# Patient Record
Sex: Male | Born: 1995 | Race: White | Hispanic: No | Marital: Single | Smoking: Current some day smoker
Health system: Southern US, Community
[De-identification: ages and names within clinical notes are randomized; demographics above are authoritative.]

## PROBLEM LIST (undated history)

## (undated) DIAGNOSIS — A6000 Herpesviral infection of urogenital system, unspecified: Secondary | ICD-10-CM

## (undated) DIAGNOSIS — F319 Bipolar disorder, unspecified: Secondary | ICD-10-CM

## (undated) DIAGNOSIS — F209 Schizophrenia, unspecified: Secondary | ICD-10-CM

## (undated) DIAGNOSIS — D649 Anemia, unspecified: Secondary | ICD-10-CM

## (undated) DIAGNOSIS — F32A Depression, unspecified: Secondary | ICD-10-CM

## (undated) DIAGNOSIS — F419 Anxiety disorder, unspecified: Secondary | ICD-10-CM

## (undated) DIAGNOSIS — F329 Major depressive disorder, single episode, unspecified: Secondary | ICD-10-CM

## (undated) HISTORY — DX: Anemia, unspecified: D64.9

## (undated) HISTORY — PX: NO PAST SURGERIES: SHX2092

## (undated) HISTORY — DX: Major depressive disorder, single episode, unspecified: F32.9

## (undated) HISTORY — DX: Depression, unspecified: F32.A

---

## 2014-12-23 ENCOUNTER — Encounter (HOSPITAL_COMMUNITY): Payer: Self-pay | Admitting: Emergency Medicine

## 2014-12-23 ENCOUNTER — Emergency Department (HOSPITAL_COMMUNITY)
Admission: EM | Admit: 2014-12-23 | Discharge: 2014-12-23 | Disposition: A | Discharge status: Home / Self Care | Payer: Medicaid Other | Attending: Emergency Medicine | Admitting: Emergency Medicine

## 2014-12-23 DIAGNOSIS — L739 Follicular disorder, unspecified: Secondary | ICD-10-CM | POA: Diagnosis not present

## 2014-12-23 DIAGNOSIS — R590 Localized enlarged lymph nodes: Secondary | ICD-10-CM | POA: Diagnosis not present

## 2014-12-23 DIAGNOSIS — Z720 Tobacco use: Secondary | ICD-10-CM | POA: Insufficient documentation

## 2014-12-23 DIAGNOSIS — R2240 Localized swelling, mass and lump, unspecified lower limb: Secondary | ICD-10-CM | POA: Diagnosis present

## 2014-12-23 MED ORDER — CEPHALEXIN 500 MG PO CAPS: 500.0000 mg | ORAL_CAPSULE | Freq: Four times a day (QID) | ORAL | Status: DC

## 2014-12-23 NOTE — ED Notes (Signed)
Pt reports "bulge" in left inguinal area; pt suspects hernia; pt also states "there are some lumps in the same area"; pt reports lightheadedness but n/v/d; pt reports regular BM;

## 2014-12-23 NOTE — ED Provider Notes (Signed)
CSN: [id number]     Arrival date & time 12/23/14  1829 History   First MD Initiated Contact with Patient 12/23/14 1953     Chief Complaint  Patient presents with  . Inguinal Hernia     (Consider location/radiation/quality/duration/timing/severity/associated sxs/prior Treatment) HPI Comments: Patient presents to the emergency department with chief complaints of lumps in groin, as well as some redness of his pubic area.  He denies any fevers chills. States that he does shave. States that he is in a monogamous relationship. Denies any penile discharge or dysuria. Denies any pain in his testicles. There are no aggravating or relieving factors. He has not tried anything to alleviate the symptoms.  The history is provided by the patient. No language interpreter was used.    History reviewed. No pertinent past medical history. History reviewed. No pertinent past surgical history. History reviewed. No pertinent family history. History  Substance Use Topics  . Smoking status: Current Every Day Smoker -- 0.50 packs/day    Types: Cigarettes  . Smokeless tobacco: Not on file  . Alcohol Use: Yes     Comment: occasional    Review of Systems  Constitutional: Negative for fever and chills.  Respiratory: Negative for shortness of breath.   Cardiovascular: Negative for chest pain.  Gastrointestinal: Negative for nausea, vomiting, diarrhea and constipation.  Genitourinary: Negative for dysuria.  Skin: Positive for color change.       Folliculitis pubic area      Allergies  Sulfa antibiotics  Home Medications   Prior to Admission medications   Not on File   BP 136/85 mmHg  Pulse 98  Temp(Src) 98.9 F (37.2 C) (Oral)  Resp 18  Ht 5\' 6"  (1.676 m)  Wt 140 lb (63.504 kg)  BMI 22.61 kg/m2  SpO2 100% Physical Exam  Constitutional: He is oriented to person, place, and time. He appears well-developed and well-nourished.  HENT:  Head: Normocephalic and atraumatic.  Eyes: Conjunctivae  and EOM are normal. Pupils are equal, round, and reactive to light. Right eye exhibits no discharge. Left eye exhibits no discharge. No scleral icterus.  Neck: Normal range of motion. Neck supple. No JVD present.  Cardiovascular: Normal rate, regular rhythm and normal heart sounds.  Exam reveals no gallop and no friction rub.   No murmur heard. Pulmonary/Chest: Effort normal and breath sounds normal. No respiratory distress. He has no wheezes. He has no rales. He exhibits no tenderness.  Abdominal: Soft. He exhibits no distension and no mass. There is no tenderness. There is no rebound and no guarding.  Genitourinary:  Normal circumcised male, left inguinal lymphadenopathy, no hernia, no other mass, abnormality, or do for about the penis, scrotum, testes  Musculoskeletal: Normal range of motion. He exhibits no edema or tenderness.  Neurological: He is alert and oriented to person, place, and time.  Skin: Skin is warm and dry.  Folliculitis of pubic region  Psychiatric: He has a normal mood and affect. His behavior is normal. Judgment and thought content normal.  Nursing note and vitals reviewed.   ED Course  Procedures (including critical care time) Labs Review Labs Reviewed - No data to display  Imaging Review No results found.   EKG Interpretation None      MDM   Final diagnoses:  Folliculitis  Lymphadenopathy, inguinal    Patient with folliculitis and inguinal lymphadenopathy. No hernia. Discharged home with Keflex. Return precautions given.    Roxy Horsemanobert Damara Klunder, PA-C 12/23/14 2013  Roxy Horsemanobert Angelis Gates, PA-C 12/23/14 2014  Margarita Grizzle, MD 12/25/14 1500

## 2014-12-23 NOTE — Discharge Instructions (Signed)
Lymphadenopathy °Lymphadenopathy means "disease of the lymph glands." But the term is usually used to describe swollen or enlarged lymph glands, also called lymph nodes. These are the bean-shaped organs found in many locations including the neck, underarm, and groin. Lymph glands are part of the immune system, which fights infections in your body. Lymphadenopathy can occur in just one area of the body, such as the neck, or it can be generalized, with lymph node enlargement in several areas. The nodes found in the neck are the most common sites of lymphadenopathy. °CAUSES °When your immune system responds to germs (such as viruses or bacteria ), infection-fighting cells and fluid build up. This causes the glands to grow in size. Usually, this is not something to worry about. Sometimes, the glands themselves can become infected and inflamed. This is called lymphadenitis. °Enlarged lymph nodes can be caused by many diseases: °· Bacterial disease, such as strep throat or a skin infection. °· Viral disease, such as a common cold. °· Other germs, such as Lyme disease, tuberculosis, or sexually transmitted diseases. °· Cancers, such as lymphoma (cancer of the lymphatic system) or leukemia (cancer of the white blood cells). °· Inflammatory diseases such as lupus or rheumatoid arthritis. °· Reactions to medications. °Many of the diseases above are rare, but important. This is why you should see your caregiver if you have lymphadenopathy. °SYMPTOMS °· Swollen, enlarged lumps in the neck, back of the head, or other locations. °· Tenderness. °· Warmth or redness of the skin over the lymph nodes. °· Fever. °DIAGNOSIS °Enlarged lymph nodes are often near the source of infection. They can help health care providers diagnose your illness. For instance: °· Swollen lymph nodes around the jaw might be caused by an infection in the mouth. °· Enlarged glands in the neck often signal a throat infection. °· Lymph nodes that are swollen in  more than one area often indicate an illness caused by a virus. °Your caregiver will likely know what is causing your lymphadenopathy after listening to your history and examining you. Blood tests, x-rays, or other tests may be needed. If the cause of the enlarged lymph node cannot be found, and it does not go away by itself, then a biopsy may be needed. Your caregiver will discuss this with you. °TREATMENT °Treatment for your enlarged lymph nodes will depend on the cause. Many times the nodes will shrink to normal size by themselves, with no treatment. Antibiotics or other medicines may be needed for infection. Only take over-the-counter or prescription medicines for pain, discomfort, or fever as directed by your caregiver. °HOME CARE INSTRUCTIONS °Swollen lymph glands usually return to normal when the underlying medical condition goes away. If they persist, contact your health-care provider. He/she might prescribe antibiotics or other treatments, depending on the diagnosis. Take any medications exactly as prescribed. Keep any follow-up appointments made to check on the condition of your enlarged nodes. °SEEK MEDICAL CARE IF: °· Swelling lasts for more than two weeks. °· You have symptoms such as weight loss, night sweats, fatigue, or fever that does not go away. °· The lymph nodes are hard, seem fixed to the skin, or are growing rapidly. °· Skin over the lymph nodes is red and inflamed. This could mean there is an infection. °SEEK IMMEDIATE MEDICAL CARE IF: °· Fluid starts leaking from the area of the enlarged lymph node. °· You develop a fever of 102° F (38.9° C) or greater. °· Severe pain develops (not necessarily at the site of a   large lymph node).  You develop chest pain or shortness of breath.  You develop worsening abdominal pain. MAKE SURE YOU:  Understand these instructions.  Will watch your condition.  Will get help right away if you are not doing well or get worse. Document Released:  05/18/2008 Document Revised: 12/24/2013 Document Reviewed: 05/18/2008 [facility] Patient Information 2015 [place], [place]. This information is not intended to replace advice given to you by your health care provider. Make sure you discuss any questions you have with your health care provider. Folliculitis  Folliculitis is redness, soreness, and swelling (inflammation) of the hair follicles. This condition can occur anywhere on the body. People with weakened immune systems, diabetes, or obesity have a greater risk of getting folliculitis. CAUSES  Bacterial infection. This is the most common cause.  Fungal infection.  Viral infection.  Contact with certain chemicals, especially oils and tars. Long-term folliculitis can result from bacteria that live in the nostrils. The bacteria may trigger multiple outbreaks of folliculitis over time. SYMPTOMS Folliculitis most commonly occurs on the scalp, thighs, legs, back, buttocks, and areas where hair is shaved frequently. An early sign of folliculitis is a small, white or yellow, pus-filled, itchy lesion (pustule). These lesions appear on a red, inflamed follicle. They are usually less than 0.2 inches (5 mm) wide. When there is an infection of the follicle that goes deeper, it becomes a boil or furuncle. A group of closely packed boils creates a larger lesion (carbuncle). Carbuncles tend to occur in hairy, sweaty areas of the body. DIAGNOSIS  Your caregiver can usually tell what is wrong by doing a physical exam. A sample may be taken from one of the lesions and tested in a lab. This can help determine what is causing your folliculitis. TREATMENT  Treatment may include:  Applying warm compresses to the affected areas.  Taking antibiotic medicines orally or applying them to the skin.  Draining the lesions if they contain a large amount of pus or fluid.  Laser hair removal for cases of long-lasting folliculitis. This helps to prevent regrowth of the  hair. HOME CARE INSTRUCTIONS  Apply warm compresses to the affected areas as directed by your caregiver.  If antibiotics are prescribed, take them as directed. Finish them even if you start to feel better.  You may take over-the-counter medicines to relieve itching.  Do not shave irritated skin.  Follow up with your caregiver as directed. SEEK IMMEDIATE MEDICAL CARE IF:   You have increasing redness, swelling, or pain in the affected area.  You have a fever. MAKE SURE YOU:  Understand these instructions.  Will watch your condition.  Will get help right away if you are not doing well or get worse. Document Released: 10/18/2001 Document Revised: 02/08/2012 Document Reviewed: 11/09/2011 [facility] Patient Information 2015 [place]ExitCare, [place]LLC. This information is not intended to replace advice given to you by your health care provider. Make sure you discuss any questions you have with your health care provider.

## 2015-05-25 ENCOUNTER — Ambulatory Visit (INDEPENDENT_AMBULATORY_CARE_PROVIDER_SITE_OTHER): Payer: Self-pay | Admitting: Emergency Medicine

## 2015-05-25 VITALS — BP 130/82 | HR 72 | Temp 98.2°F | Resp 18 | Ht 66.0 in | Wt 144.5 lb

## 2015-05-25 DIAGNOSIS — F411 Generalized anxiety disorder: Secondary | ICD-10-CM

## 2015-05-25 DIAGNOSIS — F329 Major depressive disorder, single episode, unspecified: Secondary | ICD-10-CM

## 2015-05-25 DIAGNOSIS — F32A Depression, unspecified: Secondary | ICD-10-CM

## 2015-05-25 MED ORDER — ESCITALOPRAM OXALATE 10 MG PO TABS: 10.0000 mg | ORAL_TABLET | Freq: Every day | ORAL | Status: DC

## 2015-05-25 MED ORDER — CLONAZEPAM 0.5 MG PO TABS: ORAL_TABLET | ORAL | Status: DC
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
## 2015-05-25 NOTE — Patient Instructions (Signed)
Generalized Anxiety Disorder Generalized anxiety disorder (GAD) is a mental disorder. It interferes with life functions, including relationships, work, and school. GAD is different from normal anxiety, which everyone experiences at some point in their lives in response to specific life events and activities. Normal anxiety actually helps us prepare for and get through these life events and activities. Normal anxiety goes away after the event or activity is over.  GAD causes anxiety that is not necessarily related to specific events or activities. It also causes excess anxiety in proportion to specific events or activities. The anxiety associated with GAD is also difficult to control. GAD can vary from mild to severe. People with severe GAD can have intense waves of anxiety with physical symptoms (panic attacks).  SYMPTOMS The anxiety and worry associated with GAD are difficult to control. This anxiety and worry are related to many life events and activities and also occur more days than not for 6 months or longer. People with GAD also have three or more of the following symptoms (one or more in children):  Restlessness.   Fatigue.  Difficulty concentrating.   Irritability.  Muscle tension.  Difficulty sleeping or unsatisfying sleep. DIAGNOSIS GAD is diagnosed through an assessment by your health care provider. Your health care provider will ask you questions aboutyour mood,physical symptoms, and events in your life. Your health care provider may ask you about your medical history and use of alcohol or drugs, including prescription medicines. Your health care provider may also do a physical exam and blood tests. Certain medical conditions and the use of certain substances can cause symptoms similar to those associated with GAD. Your health care provider may refer you to a mental health specialist for further evaluation. TREATMENT The following therapies are usually used to treat GAD:    Medication. Antidepressant medication usually is prescribed for long-term daily control. Antianxiety medicines may be added in severe cases, especially when panic attacks occur.   Talk therapy (psychotherapy). Certain types of talk therapy can be helpful in treating GAD by providing support, education, and guidance. A form of talk therapy called cognitive behavioral therapy can teach you healthy ways to think about and react to daily life events and activities.  Stress managementtechniques. These include yoga, meditation, and exercise and can be very helpful when they are practiced regularly. A mental health specialist can help determine which treatment is best for you. Some people see improvement with one therapy. However, other people require a combination of therapies. Document Released: 12/04/2012 Document Revised: 12/24/2013 Document Reviewed: 12/04/2012 ExitCare Patient Information 2015 ExitCare, LLC. This information is not intended to replace advice given to you by your health care provider. Make sure you discuss any questions you have with your health care provider. Depression Depression refers to feeling sad, low, down in the dumps, blue, gloomy, or empty. In general, there are two kinds of depression:  Normal sadness or normal grief. This kind of depression is one that we all feel from time to time after upsetting life experiences, such as the loss of a job or the ending of a relationship. This kind of depression is considered normal, is short lived, and resolves within a few days to 2 weeks. Depression experienced after the loss of a loved one (bereavement) often lasts longer than 2 weeks but normally gets better with time.  Clinical depression. This kind of depression lasts longer than normal sadness or normal grief or interferes with your ability to function at home, at work, and in school.   It also interferes with your personal relationships. It affects almost every aspect of your  life. Clinical depression is an illness. Symptoms of depression can also be caused by conditions other than those mentioned above, such as:  Physical illness. Some physical illnesses, including underactive thyroid gland (hypothyroidism), severe anemia, specific types of cancer, diabetes, uncontrolled seizures, heart and lung problems, strokes, and chronic pain are commonly associated with symptoms of depression.  Side effects of some prescription medicine. In some people, certain types of medicine can cause symptoms of depression.  Substance abuse. Abuse of alcohol and illicit drugs can cause symptoms of depression. SYMPTOMS Symptoms of normal sadness and normal grief include the following:  Feeling sad or crying for short periods of time.  Not caring about anything (apathy).  Difficulty sleeping or sleeping too much.  No longer able to enjoy the things you used to enjoy.  Desire to be by oneself all the time (social isolation).  Lack of energy or motivation.  Difficulty concentrating or remembering.  Change in appetite or weight.  Restlessness or agitation. Symptoms of clinical depression include the same symptoms of normal sadness or normal grief and also the following symptoms:  Feeling sad or crying all the time.  Feelings of guilt or worthlessness.  Feelings of hopelessness or helplessness.  Thoughts of suicide or the desire to harm yourself (suicidal ideation).  Loss of touch with reality (psychotic symptoms). Seeing or hearing things that are not real (hallucinations) or having false beliefs about your life or the people around you (delusions and paranoia). DIAGNOSIS  The diagnosis of clinical depression is usually based on how bad the symptoms are and how long they have lasted. Your health care provider will also ask you questions about your medical history and substance use to find out if physical illness, use of prescription medicine, or substance abuse is causing  your depression. Your health care provider may also order blood tests. TREATMENT  Often, normal sadness and normal grief do not require treatment. However, sometimes antidepressant medicine is given for bereavement to ease the depressive symptoms until they resolve. The treatment for clinical depression depends on how bad the symptoms are but often includes antidepressant medicine, counseling with a mental health professional, or both. Your health care provider will help to determine what treatment is best for you. Depression caused by physical illness usually goes away with appropriate medical treatment of the illness. If prescription medicine is causing depression, talk with your health care provider about stopping the medicine, decreasing the dose, or changing to another medicine. Depression caused by the abuse of alcohol or illicit drugs goes away when you stop using these substances. Some adults need professional help in order to stop drinking or using drugs. SEEK IMMEDIATE MEDICAL CARE IF:  You have thoughts about hurting yourself or others.  You lose touch with reality (have psychotic symptoms).  You are taking medicine for depression and have a serious side effect. FOR MORE INFORMATION  National Alliance on Mental Illness: www.nami.org  National Institute of Mental Health: www.nimh.nih.gov Document Released: 08/06/2000 Document Revised: 12/24/2013 Document Reviewed: 11/08/2011 ExitCare Patient Information 2015 ExitCare, LLC. This information is not intended to replace advice given to you by your health care provider. Make sure you discuss any questions you have with your health care provider.  

## 2015-05-26 ENCOUNTER — Telehealth: Payer: Self-pay

## 2015-05-26 NOTE — Telephone Encounter (Signed)
Pt has an appt with Dr. Lewis Moccasin on Oct 31 at [facility]. Called their office per Dr. Cleta Alberts and LM to see if we could get pt an earlier appt.

## 2015-05-27 NOTE — Telephone Encounter (Signed)
Spoke with Dr. Langston Masker' office. They would be willing to see him at 730 in the morning if Dr. Ellis Parents needs him to be seen sooner. Advised that I would talk to the pt and call them back

## 2015-06-01 ENCOUNTER — Emergency Department (HOSPITAL_COMMUNITY)
Admission: EM | Admit: 2015-06-01 | Discharge: 2015-06-02 | Disposition: A | Discharge status: Other Acute Inpatient Hospital | Payer: Medicaid Other | Attending: Emergency Medicine | Admitting: Emergency Medicine

## 2015-06-01 ENCOUNTER — Encounter (HOSPITAL_COMMUNITY): Payer: Self-pay

## 2015-06-01 DIAGNOSIS — Z720 Tobacco use: Secondary | ICD-10-CM | POA: Diagnosis not present

## 2015-06-01 DIAGNOSIS — F419 Anxiety disorder, unspecified: Secondary | ICD-10-CM | POA: Diagnosis not present

## 2015-06-01 DIAGNOSIS — F19951 Other psychoactive substance use, unspecified with psychoactive substance-induced psychotic disorder with hallucinations: Secondary | ICD-10-CM | POA: Diagnosis not present

## 2015-06-01 DIAGNOSIS — F1410 Cocaine abuse, uncomplicated: Secondary | ICD-10-CM | POA: Diagnosis not present

## 2015-06-01 DIAGNOSIS — F19959 Other psychoactive substance use, unspecified with psychoactive substance-induced psychotic disorder, unspecified: Secondary | ICD-10-CM

## 2015-06-01 DIAGNOSIS — G4700 Insomnia, unspecified: Secondary | ICD-10-CM | POA: Insufficient documentation

## 2015-06-01 DIAGNOSIS — F1510 Other stimulant abuse, uncomplicated: Secondary | ICD-10-CM | POA: Diagnosis not present

## 2015-06-01 DIAGNOSIS — F32A Depression, unspecified: Secondary | ICD-10-CM

## 2015-06-01 DIAGNOSIS — Z862 Personal history of diseases of the blood and blood-forming organs and certain disorders involving the immune mechanism: Secondary | ICD-10-CM | POA: Diagnosis not present

## 2015-06-01 DIAGNOSIS — Z79899 Other long term (current) drug therapy: Secondary | ICD-10-CM | POA: Insufficient documentation

## 2015-06-01 DIAGNOSIS — F329 Major depressive disorder, single episode, unspecified: Secondary | ICD-10-CM | POA: Diagnosis not present

## 2015-06-01 DIAGNOSIS — R45851 Suicidal ideations: Secondary | ICD-10-CM | POA: Diagnosis present

## 2015-06-01 HISTORY — DX: Anxiety disorder, unspecified: F41.9

## 2015-06-01 HISTORY — DX: Herpesviral infection of urogenital system, unspecified: A60.00

## 2015-06-01 LAB — COMPREHENSIVE METABOLIC PANEL
ALT: 37 U/L (ref 17–63)
ANION GAP: 14 (ref 5–15)
AST: 74 U/L — ABNORMAL HIGH (ref 15–41)
Albumin: 4.6 g/dL (ref 3.5–5.0)
Alkaline Phosphatase: 68 U/L (ref 38–126)
BUN: 15 mg/dL (ref 6–20)
CALCIUM: 9.4 mg/dL (ref 8.9–10.3)
CHLORIDE: 97 mmol/L — AB (ref 101–111)
CO2: 24 mmol/L (ref 22–32)
Creatinine, Ser: 1.09 mg/dL (ref 0.61–1.24)
GFR calc non Af Amer: >60 mL/min (ref >60)
Glucose, Bld: 81 mg/dL (ref 65–99)
Potassium: 3.4 mmol/L — ABNORMAL LOW (ref 3.5–5.1)
Sodium: 135 mmol/L (ref 135–145)
Total Bilirubin: 1.6 mg/dL — ABNORMAL HIGH (ref 0.3–1.2)
Total Protein: 7.1 g/dL (ref 6.5–8.1)

## 2015-06-01 LAB — CBC WITH DIFFERENTIAL/PLATELET
Basophils Absolute: 0 10*3/uL (ref 0.0–0.1)
Basophils Relative: 0 %
EOS PCT: 0 %
Eosinophils Absolute: 0 10*3/uL (ref 0.0–0.7)
HCT: 43.8 % (ref 39.0–52.0)
Hemoglobin: 15.9 g/dL (ref 13.0–17.0)
LYMPHS ABS: 1.6 10*3/uL (ref 0.7–4.0)
Lymphocytes Relative: 16 %
MCH: 31.2 pg (ref 26.0–34.0)
MCHC: 36.3 g/dL — AB (ref 30.0–36.0)
MCV: 86.1 fL (ref 78.0–100.0)
MONOS PCT: 12 %
Monocytes Absolute: 1.2 10*3/uL — ABNORMAL HIGH (ref 0.1–1.0)
Neutro Abs: 7.3 10*3/uL (ref 1.7–7.7)
Neutrophils Relative %: 72 %
PLATELETS: 264 10*3/uL (ref 150–400)
RBC: 5.09 MIL/uL (ref 4.22–5.81)
RDW: 11.7 % (ref 11.5–15.5)
WBC: 10.2 10*3/uL (ref 4.0–10.5)

## 2015-06-01 LAB — RAPID URINE DRUG SCREEN, HOSP PERFORMED
Amphetamines: POSITIVE — AB
BENZODIAZEPINES: NOT DETECTED
Barbiturates: NOT DETECTED
Cocaine: POSITIVE — AB
Opiates: NOT DETECTED
Tetrahydrocannabinol: NOT DETECTED

## 2015-06-01 LAB — ETHANOL

## 2015-06-01 LAB — SALICYLATE LEVEL

## 2015-06-01 LAB — ACETAMINOPHEN LEVEL

## 2015-06-01 MED ORDER — LORAZEPAM 1 MG PO TABS
1.0000 mg | ORAL_TABLET | Freq: Three times a day (TID) | ORAL | Status: DC | PRN
  Administered 2015-06-01 – 2015-06-02 (×3): 1 mg via ORAL
  Filled 2015-06-01 (×3): qty 1

## 2015-06-01 MED ORDER — ALUM & MAG HYDROXIDE-SIMETH 200-200-20 MG/5ML PO SUSP: 30.0000 mL | ORAL | Status: DC | PRN

## 2015-06-01 MED ORDER — NICOTINE 21 MG/24HR TD PT24: 21.0000 mg | MEDICATED_PATCH | Freq: Every day | TRANSDERMAL | Status: DC

## 2015-06-01 MED ORDER — ONDANSETRON HCL 4 MG PO TABS: 4.0000 mg | ORAL_TABLET | Freq: Three times a day (TID) | ORAL | Status: DC | PRN

## 2015-06-01 MED ORDER — NICOTINE 21 MG/24HR TD PT24
21.0000 mg | MEDICATED_PATCH | Freq: Once | TRANSDERMAL | Status: DC
  Administered 2015-06-01: 21 mg via TRANSDERMAL
  Filled 2015-06-01 (×2): qty 1

## 2015-06-01 MED ORDER — ACETAMINOPHEN 325 MG PO TABS: 650.0000 mg | ORAL_TABLET | ORAL | Status: DC | PRN

## 2015-06-01 MED ORDER — ESCITALOPRAM OXALATE 10 MG PO TABS
10.0000 mg | ORAL_TABLET | Freq: Every day | ORAL | Status: DC
  Administered 2015-06-01 – 2015-06-02 (×2): 10 mg via ORAL
  Filled 2015-06-01 (×2): qty 1

## 2015-06-01 MED ORDER — IBUPROFEN 400 MG PO TABS: 600.0000 mg | ORAL_TABLET | Freq: Three times a day (TID) | ORAL | Status: DC | PRN

## 2015-06-01 MED ORDER — NICOTINE 21 MG/24HR TD PT24
21.0000 mg | MEDICATED_PATCH | Freq: Once | TRANSDERMAL | Status: DC
  Administered 2015-06-01: 21 mg via TRANSDERMAL

## 2015-06-01 NOTE — ED Notes (Signed)
Breakfast tray ordered @0711 
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
## 2015-06-01 NOTE — ED Notes (Signed)
At bedside with pt as sitter
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
## 2015-06-01 NOTE — ED Notes (Signed)
Patient given chicken broth and crackers; sitter at bedside

## 2015-06-01 NOTE — ED Notes (Signed)
Pt asked for RN to assess his arms d/t states has been injecting meth and is concerned. Bil anterior forearms noted w/track marks - no redness/swelling/drainage noted. Discoloration noted to right bicep area and left forearm. Pt asking for 2nd Nicotine Patch to be applied d/t states 1 is not strong enough. Advised pt no - voiced understanding then stated he wants to leave so he can go outside to smoke. Asked pt if he is still SI - states yes. Advised pt he may not leave and explained IVC process. Voiced understanding. States he wants to go to an inpt facility in [place]. Advised pt of process w/[facility] search for placement. States he wants to leave and go to a hospital in [place] at this time. Advised pt not safe at this time for him to leave d/t SI. Voiced understanding.

## 2015-06-01 NOTE — ED Notes (Signed)
Dinner tray has arrived 
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
## 2015-06-01 NOTE — ED Notes (Signed)
Patient refused snacks; dinner order taken; sitter at bedside
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
## 2015-06-02 ENCOUNTER — Inpatient Hospital Stay (HOSPITAL_COMMUNITY)
Admission: AD | Admit: 2015-06-02 | Discharge: 2015-06-02 | DRG: 897 | Disposition: A | Discharge status: Home / Self Care | Payer: Medicaid Other | Source: Intra-hospital | Attending: Psychiatry | Admitting: Psychiatry

## 2015-06-02 ENCOUNTER — Encounter (HOSPITAL_COMMUNITY): Payer: Self-pay

## 2015-06-02 DIAGNOSIS — Z8249 Family history of ischemic heart disease and other diseases of the circulatory system: Secondary | ICD-10-CM | POA: Diagnosis not present

## 2015-06-02 DIAGNOSIS — Z818 Family history of other mental and behavioral disorders: Secondary | ICD-10-CM | POA: Diagnosis not present

## 2015-06-02 DIAGNOSIS — R45851 Suicidal ideations: Secondary | ICD-10-CM | POA: Diagnosis present

## 2015-06-02 DIAGNOSIS — G4700 Insomnia, unspecified: Secondary | ICD-10-CM | POA: Diagnosis present

## 2015-06-02 DIAGNOSIS — F1994 Other psychoactive substance use, unspecified with psychoactive substance-induced mood disorder: Secondary | ICD-10-CM | POA: Diagnosis present

## 2015-06-02 DIAGNOSIS — F19951 Other psychoactive substance use, unspecified with psychoactive substance-induced psychotic disorder with hallucinations: Secondary | ICD-10-CM | POA: Diagnosis not present

## 2015-06-02 DIAGNOSIS — F19959 Other psychoactive substance use, unspecified with psychoactive substance-induced psychotic disorder, unspecified: Secondary | ICD-10-CM

## 2015-06-02 DIAGNOSIS — F410 Panic disorder [episodic paroxysmal anxiety] without agoraphobia: Secondary | ICD-10-CM | POA: Diagnosis present

## 2015-06-02 DIAGNOSIS — F17210 Nicotine dependence, cigarettes, uncomplicated: Secondary | ICD-10-CM | POA: Diagnosis present

## 2015-06-02 DIAGNOSIS — Z833 Family history of diabetes mellitus: Secondary | ICD-10-CM | POA: Diagnosis not present

## 2015-06-02 DIAGNOSIS — F19159 Other psychoactive substance abuse with psychoactive substance-induced psychotic disorder, unspecified: Principal | ICD-10-CM | POA: Diagnosis present

## 2015-06-02 DIAGNOSIS — F329 Major depressive disorder, single episode, unspecified: Secondary | ICD-10-CM | POA: Diagnosis not present

## 2015-06-02 MED ORDER — ESCITALOPRAM OXALATE 10 MG PO TABS: 10.0000 mg | ORAL_TABLET | Freq: Every day | ORAL | Status: DC

## 2015-06-02 MED ORDER — TRAZODONE HCL 50 MG PO TABS: 50.0000 mg | ORAL_TABLET | Freq: Every evening | ORAL | Status: DC | PRN

## 2015-06-02 MED ORDER — NICOTINE 21 MG/24HR TD PT24: 21.0000 mg | MEDICATED_PATCH | Freq: Every day | TRANSDERMAL | Status: DC

## 2015-06-02 MED ORDER — MAGNESIUM HYDROXIDE 400 MG/5ML PO SUSP: 30.0000 mL | Freq: Every day | ORAL | Status: DC | PRN

## 2015-06-02 MED ORDER — NICOTINE 21 MG/24HR TD PT24
21.0000 mg | MEDICATED_PATCH | Freq: Every day | TRANSDERMAL | Status: DC
  Administered 2015-06-02: 21 mg via TRANSDERMAL
  Filled 2015-06-02 (×3): qty 1

## 2015-06-02 MED ORDER — ALUM & MAG HYDROXIDE-SIMETH 200-200-20 MG/5ML PO SUSP: 30.0000 mL | ORAL | Status: DC | PRN

## 2015-06-02 MED ORDER — ESCITALOPRAM OXALATE 10 MG PO TABS
10.0000 mg | ORAL_TABLET | Freq: Every day | ORAL | Status: DC
  Filled 2015-06-02 (×2): qty 1

## 2015-06-02 MED ORDER — CLONAZEPAM 1 MG PO TABS: 1.0000 mg | ORAL_TABLET | Freq: Every day | ORAL | Status: DC | PRN

## 2015-06-02 MED ORDER — ACETAMINOPHEN 325 MG PO TABS: 650.0000 mg | ORAL_TABLET | Freq: Four times a day (QID) | ORAL | Status: DC | PRN
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
## 2015-06-02 NOTE — [facility] Suicide Risk Assessment (Signed)
[facility] INPATIENT:  Family/Significant Other Suicide Prevention Education  Suicide Prevention Education:  Education Completed; mother [name] [phone],  (name of family member/significant other) has been identified by the patient as the family member/significant other with whom the patient will be residing, and identified as the person(s) who will aid the patient in the event of a mental health crisis (suicidal ideations/suicide attempt).  With written consent from the patient, the family member/significant other has been provided the following suicide prevention education, prior to the and/or following the discharge of the patient.  The suicide prevention education provided includes the following:  Suicide risk factors  Suicide prevention and interventions  National Suicide Hotline telephone number  [facility] assessment telephone number  [facility] Emergency Assistance 911  [facility] and/or Residential Mobile Crisis Unit telephone number  Request made of family/significant other to:  Remove weapons (e.g., guns, rifles, knives), all items previously/currently identified as safety concern.    Remove drugs/medications (over-the-counter, prescriptions, illicit drugs), all items previously/currently identified as a safety concern.  The family member/significant other verbalizes understanding of the suicide prevention education information provided.  The family member/significant other agrees to remove the items of safety concern listed above.  Deloros Beretta, [place] 06/02/2015, 3:58 PM

## 2015-06-02 NOTE — ED Notes (Signed)
Medicated with ativan per patient request.

## 2015-06-02 NOTE — Progress Notes (Signed)
Spoke with L. Earlene Plater, psychiatry NP, who has evaluated pt this morning and recommends he be admitted to inpatient psych.  Per Minerva Areola, [facility], pt accepted to [facility] bed 306-1 by Dr. Dub Mikes. Pt can arrive at 12 pm today. Number for RN report is [phone].  Ilean Skill, MSW, LCSW Clinical Social Work, Disposition  06/02/2015 [phone]
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
## 2015-06-02 NOTE — [facility] Counselor (Signed)
PSA not completed as patient discharged prior to 24 hours.   Samuella Bruin, MSW, [employer] Clinical Social Worker [facility] [phone]

## 2015-06-02 NOTE — Tx Team (Signed)
Initial Interdisciplinary Treatment Plan   PATIENT STRESSORS: Educational concerns Substance abuse   PATIENT STRENGTHS: Ability for insight Average or above average intelligence Capable of independent living Communication skills Supportive family/friends   PROBLEM LIST: Problem List/Patient Goals Date to be addressed Date deferred Reason deferred Estimated date of resolution  "I want the quickest discharge" 06/02/2015           "I want the quickest discharge" 06/02/2015           Suicide Ideation 06/02/2015                              DISCHARGE CRITERIA:  Ability to meet basic life and health needs Adequate post-discharge living arrangements Verbal commitment to aftercare and medication compliance  PRELIMINARY DISCHARGE PLAN: Attend PHP/IOP Return to previous living arrangement Return to previous work or school arrangements  PATIENT/FAMIILY INVOLVEMENT: This treatment plan has been presented to and reviewed with the patient, Sharvil Hoey, and/or family member.  The patient and family have been given the opportunity to ask questions and make suggestions.  Audrie Lia Iwenekha 06/02/2015, 3:02 PM

## 2015-06-02 NOTE — Consult Note (Signed)
Telepsych Consultation   Reason for Consult: Discharge Disposition  Referring Physician: [facility] EDP Patient Identification: Johnny Fuller MRN:  [id number] Principal Diagnosis: Substance or medication-induced psychotic disorder [facility]) Diagnosis:   Patient Active Problem List   Diagnosis Date Noted  . 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 male patient admitted with substance abuse, anxiety, and suicidal ideation.   HPI:    Johnny Fuller is a 19 y.o. male with a PMH of depression who presents to the ED with depression and suicidal ideation. Per notes he was quite drowsy during the initial assessments and would not talk. Patient reported plan to hang himself last night. Also has had two past overdose attempts reporting SI for the past few years. His urine drug screen is positive for amphetamines and cocaine. Patient is coherent this morning and cooperative with assessment stating "I would like to go home. I am no longer hearing voices. I can stop using drugs. I did have thoughts to hang myself but did not act on it. I tried to overdose on Trazodone about four months ago but I just got sick. I think another time I took a handful of sleeping pills. I did not realize that the drugs could make me psychotic." Johnny Fuller admits to severe depressive symptoms for several months. He has several risk factors for self harm to include poor support, unemployed, past suicide attempts and drug abuse. Per notes in epic the patient appears to be downplaying his drug use. Patient also complains of problems with anxiety and poor sleep for days at a time. Patient reports using amphetamines to cope with "fatigue". The patient did not want writer to contact his foster family for collateral information stating "I don't want them involved." Attempted to contact his pastor Art Buff at [facility] but  he was reported to be out of the office for the day. He denies any psychotic symptoms today and does not appear to be responding to internal stimuli.   HPI Elements:   Location:  psychosis, suicidal ideation . Quality:  abuse of meth, adderrall, and cocaine. Severity:  Severe. Timing:  Last few months. Duration:  Acute . Context:  Depressive symptoms, psychosis from drug abuse, suicidal thoughts.  Past Medical History:  Past Medical History  Diagnosis Date  . Anemia   . Depression   . Anxiety   . Genital herpes    History reviewed. No pertinent past surgical history. Family History:  Family History  Problem Relation Age of Onset  . Diabetes Father   . Hyperlipidemia Father   . Hypertension Father    Social History:  History  Alcohol Use  . 0.0 oz/week  . 0 Standard drinks or equivalent per week    Comment: occasional     History  Drug Use  . Yes  . 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 Marital Status: Single    Spouse Name: N/A  . Number of Children: N/A  . Years of Education: N/A   Social History Main Topics  . Smoking status: Current Every Day Smoker -- 1.00 packs/day    Types: Cigarettes  . Smokeless tobacco: None  . Alcohol Use: 0.0 oz/week    0 Standard drinks or equivalent per week     Comment: occasional  .  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 Sexual Activity: Not Asked   Other Topics Concern  . None   Social History Narrative   Additional Social History:    Pain Medications: unable to assess Prescriptions: unable to assess Over the Counter: unable to assess History of alcohol / drug use?: Yes Name of Substance 1: methamphetamine 1 - Age of First Use: unknown 1 - Amount (size/oz): unknown 1 - Frequency: unknown 1 - Duration: unknown 1 - Last Use / Amount: 05/31/15 - injected  meth Name of Substance 2: alcohol 2 - Last Use / Amount: 05/30/05 - unknown amount                 Allergies:   Allergies  Allergen Reactions  . Sulfa Antibiotics     Unknown reaction was told as a child     Labs:  Results for orders placed or performed during the hospital encounter of 06/01/15 (from the past 48 hour(s))  Urine rapid drug screen (hosp performed)     Status: Abnormal   Collection Time: 06/01/15  7:37 AM  Result Value Ref Range   Opiates NONE DETECTED NONE DETECTED   Cocaine POSITIVE (A) NONE DETECTED   Benzodiazepines NONE DETECTED NONE DETECTED   Amphetamines POSITIVE (A) NONE DETECTED   Tetrahydrocannabinol NONE DETECTED NONE DETECTED   Barbiturates NONE DETECTED NONE DETECTED    Comment:        DRUG SCREEN FOR MEDICAL PURPOSES ONLY.  IF CONFIRMATION IS NEEDED FOR ANY PURPOSE, NOTIFY LAB WITHIN 5 DAYS.        LOWEST DETECTABLE LIMITS FOR URINE DRUG SCREEN Drug Class       Cutoff (ng/mL) Amphetamine      1000 Barbiturate      200 Benzodiazepine   299 Tricyclics       371 Opiates          300 Cocaine          300 THC              50   Ethanol     Status: None   Collection Time: 06/01/15  8:55 AM  Result Value Ref Range   Alcohol, Ethyl (B) <5 <5 mg/dL    Comment:        LOWEST DETECTABLE LIMIT FOR SERUM ALCOHOL IS 5 mg/dL FOR MEDICAL PURPOSES ONLY   Salicylate level     Status: None   Collection Time: 06/01/15  8:55 AM  Result Value Ref Range   Salicylate Lvl <6.9 2.8 - 30.0 mg/dL  Acetaminophen level     Status: Abnormal   Collection Time: 06/01/15  8:55 AM  Result Value Ref Range   Acetaminophen (Tylenol), Serum <10 (L) 10 - 30 ug/mL    Comment:        THERAPEUTIC CONCENTRATIONS VARY SIGNIFICANTLY. A RANGE OF 10-30 ug/mL MAY BE AN EFFECTIVE CONCENTRATION FOR MANY PATIENTS. HOWEVER, SOME ARE BEST TREATED AT CONCENTRATIONS OUTSIDE THIS RANGE. ACETAMINOPHEN CONCENTRATIONS >150 ug/mL AT 4 HOURS AFTER INGESTION AND >50 ug/mL AT  12 HOURS AFTER INGESTION ARE OFTEN ASSOCIATED WITH TOXIC REACTIONS.   CBC with Differential     Status: Abnormal   Collection Time: 06/01/15  8:56 AM  Result Value Ref Range   WBC 10.2 4.0 - 10.5 K/uL   RBC 5.09 4.22 - 5.81 MIL/uL   Hemoglobin 15.9 13.0 - 17.0 g/dL   HCT  43.8 39.0 - 52.0 %   MCV 86.1 78.0 - 100.0 fL   MCH 31.2 26.0 - 34.0 pg   MCHC 36.3 (H) 30.0 - 36.0 g/dL   RDW 11.7 11.5 - 15.5 %   Platelets 264 150 - 400 K/uL   Neutrophils Relative % 72 %   Neutro Abs 7.3 1.7 - 7.7 K/uL   Lymphocytes Relative 16 %   Lymphs Abs 1.6 0.7 - 4.0 K/uL   Monocytes Relative 12 %   Monocytes Absolute 1.2 (H) 0.1 - 1.0 K/uL   Eosinophils Relative 0 %   Eosinophils Absolute 0.0 0.0 - 0.7 K/uL   Basophils Relative 0 %   Basophils Absolute 0.0 0.0 - 0.1 K/uL  Comprehensive metabolic panel     Status: Abnormal   Collection Time: 06/01/15  8:56 AM  Result Value Ref Range   Sodium 135 135 - 145 mmol/L   Potassium 3.4 (L) 3.5 - 5.1 mmol/L   Chloride 97 (L) 101 - 111 mmol/L   CO2 24 22 - 32 mmol/L   Glucose, Bld 81 65 - 99 mg/dL   BUN 15 6 - 20 mg/dL   Creatinine, Ser 1.09 0.61 - 1.24 mg/dL   Calcium 9.4 8.9 - 10.3 mg/dL   Total Protein 7.1 6.5 - 8.1 g/dL   Albumin 4.6 3.5 - 5.0 g/dL   AST 74 (H) 15 - 41 U/L   ALT 37 17 - 63 U/L   Alkaline Phosphatase 68 38 - 126 U/L   Total Bilirubin 1.6 (H) 0.3 - 1.2 mg/dL   GFR calc non Af Amer >60 >60 mL/min   GFR calc Af Amer >60 >60 mL/min    Comment: (NOTE) The eGFR has been calculated using the CKD EPI equation. This calculation has not been validated in all clinical situations. eGFR's persistently <60 mL/min signify possible Chronic Kidney Disease.    Anion gap 14 5 - 15    Vitals: Blood pressure 125/72, pulse 85, temperature 98.2 F (36.8 C), temperature source Oral, resp. rate 16, height '5\' 4"'  (1.626 m), weight 65.772 kg (145 lb), SpO2 100 %.  Risk to Self: Suicidal Ideation:  (unable to assess) Suicidal Intent:  (unable to  assess) Is patient at risk for suicide?: Yes Suicidal Plan?:  (per PA, pt planned to hang himself last night) Access to Means: Yes (access to materials with which to hang himself) What has been your use of drugs/alcohol within the last 12 months?: unable to assess - used meth and alcohol last night Other Self Harm Risks: UTA (UTA) Triggers for Past Attempts:  (UTA) Intentional Self Injurious Behavior:  (UTA) Risk to Others: Homicidal Ideation:  (UTA) Thoughts of Harm to Others:  (UTA) Current Homicidal Intent:  (UTA) Current Homicidal Plan:  (UTA) Access to Homicidal Means:  (UTA) Identified Victim: UTA History of harm to others?:  (UTA) Assessment of Violence: None Noted Violent Behavior Description: UTA Does patient have access to weapons?:  (UTA) Criminal Charges Pending?: No Does patient have a court date: No Prior Inpatient Therapy: Prior Inpatient Therapy:  (UTA) Prior Outpatient Therapy: Prior Outpatient Therapy:  (UTA) Does patient have an ACCT team?:  (UTA) Does patient have Intensive In-House Services?  : No Does patient have [facility] services? : Unknown Does patient have P4[facility] services?: Unknown  Current Facility-Administered Medications  Medication Dose Route Frequency Provider Last Rate Last Dose  . acetaminophen (TYLENOL) tablet 650 mg  650 mg Oral Q4H PRN Marella Chimes, PA-C      .  alum & mag hydroxide-simeth (MAALOX/MYLANTA) 200-200-20 MG/5ML suspension 30 mL  30 mL Oral PRN Marella Chimes, PA-C      . escitalopram (LEXAPRO) tablet 10 mg  10 mg Oral Daily Marella Chimes, PA-C   10 mg at 06/02/15 0908  . ibuprofen (ADVIL,MOTRIN) tablet 600 mg  600 mg Oral Q8H PRN Marella Chimes, PA-C      . LORazepam (ATIVAN) tablet 1 mg  1 mg Oral Q8H PRN Marella Chimes, PA-C   1 mg at 06/02/15 0604  . nicotine (NICODERM CQ - dosed in mg/24 hours) patch 21 mg  21 mg Transdermal Once Leonard Schwartz, MD   21 mg at 06/01/15 1516  . ondansetron (ZOFRAN)  tablet 4 mg  4 mg Oral Q8H PRN Marella Chimes, PA-C       Current Outpatient Prescriptions  Medication Sig Dispense Refill  . clonazePAM (KLONOPIN) 0.5 MG tablet Take 1 tablet as needed for stress or anxiety. 10 tablet 1  . escitalopram (LEXAPRO) 10 MG tablet Take 1 tablet (10 mg total) by mouth daily. 30 tablet 1  . cephALEXin (KEFLEX) 500 MG capsule Take 1 capsule (500 mg total) by mouth 4 (four) times daily. (Patient not taking: Reported on 05/25/2015) 40 capsule 0  . valACYclovir (VALTREX) 1000 MG tablet Take 1,000 mg by mouth daily.      Musculoskeletal: Strength & Muscle Tone: within normal limits Gait & Station: normal Patient leans: N/A  Psychiatric Specialty Exam: Physical Exam  Review of Systems  Constitutional: Negative.   HENT: Negative.   Eyes: Negative.   Respiratory: Negative.   Cardiovascular: Negative.   Gastrointestinal: Negative.   Genitourinary: Negative.   Musculoskeletal: Negative.   Skin: Negative.   Neurological: Negative.   Endo/Heme/Allergies: Negative.   Psychiatric/Behavioral: Positive for depression, suicidal ideas and substance abuse. The patient is nervous/anxious and has insomnia.     Blood pressure 125/72, pulse 85, temperature 98.2 F (36.8 C), temperature source Oral, resp. rate 16, height '5\' 4"'  (1.626 m), weight 65.772 kg (145 lb), SpO2 100 %.Body mass index is 24.88 kg/(m^2).  General Appearance: Disheveled  Eye Contact::  Good  Speech:  Clear and Coherent  Volume:  Normal  Mood:  Anxious  Affect:  Appropriate  Thought Process:  Coherent  Orientation:  Full (Time, Place, and Person)  Thought Content:  WDL  Suicidal Thoughts:  No  Homicidal Thoughts:  No  Memory:  Immediate;   Good Recent;   Good Remote;   Good  Judgement:  Impaired  Insight:  Shallow  Psychomotor Activity:  Normal  Concentration:  Good  Recall:  Good  Fund of Knowledge:Good  Language: Good  Akathisia:  No  Handed:  Right  AIMS (if indicated):      Assets:  Communication Skills Desire for Improvement Physical Health Resilience Social Support  ADL's:  Intact  Cognition: WNL  Sleep:      Medical Decision Making: Review of Psycho-Social Stressors (1) and Review or order clinical lab tests (1)  Plan:  Recommend psychiatric Inpatient admission when medically cleared. Disposition: Recommend inpatient treatment for depressive symptoms including suicidal ideation and for substance abuse treatment.   Elmarie Shiley, NP-C 06/02/2015 11:25 AM

## 2015-06-02 NOTE — Progress Notes (Signed)
Patient discharged home with prescription. Patient was stable and appreciative at that time. All papers and prescriptions were given and valuables returned. Verbal understanding expressed. Denies SI/HI and A/VH. Patient given opportunity to express concerns and ask questions.  

## 2015-06-02 NOTE — Progress Notes (Signed)
Patient is a 19 years old male who is admitted to [address] hall with history of Schizophrenia spectrum.  Patient is alert and oriented x 4.  Patient mood and affect is flat.  Denies pain, auditory and visual hallucinations.   Patient currently denies thought to hurt himself and others.  Patient states, "I have thought in the past but not now."  Patient oriented to unit, staff and room.  Routine safety checks initiated and maintained.
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
## 2015-06-02 NOTE — Progress Notes (Signed)
  [facility] Adult Case Management Discharge Plan :  Will you be returning to the same living situation after discharge:  Yes,  Patient plans to return home At discharge, do you have transportation home?: Yes,  mother will provide transportation Do you have the ability to pay for your medications: Yes,  patient provided with prescriptions at discharge  Release of information consent forms completed and in the chart;  Patient's signature needed at discharge.  Patient to Follow up at: Follow-up Information    Follow up with [facility] On 06/04/2015.   Why:  Appt on this date at 7:30AM for counseling/hospital follow-up.    Contact information:   [address] [place], [place] [address] Phone: [phone] Fax: [phone]      Follow up with [facility] On 06/04/2015.   Why:  Appt on this date at 4:15PM with Deliah Boston PA for hospital follow-up/medication management.    Contact information:   [address] [place], [place] [address] Phone: [phone] Fax: [phone]      Patient denies SI/HI: Yes,  denies    Safety Planning and Suicide Prevention discussed: Yes,  with patient and mother  Have you used any form of tobacco in the last 30 days? (Cigarettes, Smokeless Tobacco, Cigars, and/or Pipes): Yes  Has patient been referred to the Quitline?: Patient refused referral  Johnny Fuller, Johnny Fuller 06/02/2015, 3:59 PM

## 2015-06-02 NOTE — ED Notes (Signed)
Drink was given to patient and regular diet order taken for lunch.

## 2015-06-04 ENCOUNTER — Inpatient Hospital Stay: Payer: Self-pay | Admitting: Physician Assistant

## 2015-06-08 ENCOUNTER — Inpatient Hospital Stay (HOSPITAL_COMMUNITY)
Admission: EM | Admit: 2015-06-08 | Discharge: 2015-06-14 | DRG: 917 | Disposition: A | Discharge status: Other Acute Inpatient Hospital | Payer: Medicaid Other | Attending: Internal Medicine | Admitting: Internal Medicine

## 2015-06-08 ENCOUNTER — Emergency Department (HOSPITAL_COMMUNITY): Payer: Medicaid Other

## 2015-06-08 DIAGNOSIS — J15211 Pneumonia due to Methicillin susceptible Staphylococcus aureus: Secondary | ICD-10-CM | POA: Diagnosis not present

## 2015-06-08 DIAGNOSIS — R40243 Glasgow coma scale score 3-8, unspecified time: Secondary | ICD-10-CM

## 2015-06-08 DIAGNOSIS — J189 Pneumonia, unspecified organism: Secondary | ICD-10-CM | POA: Diagnosis present

## 2015-06-08 DIAGNOSIS — R4020 Unspecified coma: Secondary | ICD-10-CM | POA: Diagnosis present

## 2015-06-08 DIAGNOSIS — J9601 Acute respiratory failure with hypoxia: Secondary | ICD-10-CM

## 2015-06-08 DIAGNOSIS — D649 Anemia, unspecified: Secondary | ICD-10-CM | POA: Diagnosis present

## 2015-06-08 DIAGNOSIS — F419 Anxiety disorder, unspecified: Secondary | ICD-10-CM | POA: Diagnosis present

## 2015-06-08 DIAGNOSIS — F329 Major depressive disorder, single episode, unspecified: Secondary | ICD-10-CM | POA: Diagnosis present

## 2015-06-08 DIAGNOSIS — Z23 Encounter for immunization: Secondary | ICD-10-CM

## 2015-06-08 DIAGNOSIS — I110 Hypertensive heart disease with heart failure: Secondary | ICD-10-CM | POA: Diagnosis present

## 2015-06-08 DIAGNOSIS — G92 Toxic encephalopathy: Secondary | ICD-10-CM | POA: Diagnosis present

## 2015-06-08 DIAGNOSIS — T50992A Poisoning by other drugs, medicaments and biological substances, intentional self-harm, initial encounter: Principal | ICD-10-CM | POA: Diagnosis present

## 2015-06-08 DIAGNOSIS — F17200 Nicotine dependence, unspecified, uncomplicated: Secondary | ICD-10-CM | POA: Diagnosis present

## 2015-06-08 DIAGNOSIS — I5181 Takotsubo syndrome: Secondary | ICD-10-CM | POA: Diagnosis present

## 2015-06-08 DIAGNOSIS — IMO0002 Reserved for concepts with insufficient information to code with codable children: Secondary | ICD-10-CM

## 2015-06-08 DIAGNOSIS — J988 Other specified respiratory disorders: Secondary | ICD-10-CM

## 2015-06-08 DIAGNOSIS — T424X2A Poisoning by benzodiazepines, intentional self-harm, initial encounter: Secondary | ICD-10-CM | POA: Diagnosis present

## 2015-06-08 DIAGNOSIS — A6000 Herpesviral infection of urogenital system, unspecified: Secondary | ICD-10-CM | POA: Diagnosis present

## 2015-06-08 DIAGNOSIS — J95851 Ventilator associated pneumonia: Secondary | ICD-10-CM | POA: Diagnosis not present

## 2015-06-08 DIAGNOSIS — I5020 Unspecified systolic (congestive) heart failure: Secondary | ICD-10-CM | POA: Diagnosis present

## 2015-06-08 DIAGNOSIS — I248 Other forms of acute ischemic heart disease: Secondary | ICD-10-CM | POA: Diagnosis present

## 2015-06-08 DIAGNOSIS — G9340 Encephalopathy, unspecified: Secondary | ICD-10-CM | POA: Diagnosis present

## 2015-06-08 DIAGNOSIS — Y95 Nosocomial condition: Secondary | ICD-10-CM | POA: Diagnosis not present

## 2015-06-08 DIAGNOSIS — J9600 Acute respiratory failure, unspecified whether with hypoxia or hypercapnia: Secondary | ICD-10-CM | POA: Diagnosis present

## 2015-06-08 DIAGNOSIS — E876 Hypokalemia: Secondary | ICD-10-CM | POA: Diagnosis present

## 2015-06-08 DIAGNOSIS — I429 Cardiomyopathy, unspecified: Secondary | ICD-10-CM

## 2015-06-08 DIAGNOSIS — Y848 Other medical procedures as the cause of abnormal reaction of the patient, or of later complication, without mention of misadventure at the time of the procedure: Secondary | ICD-10-CM | POA: Diagnosis not present

## 2015-06-08 LAB — CBC WITH DIFFERENTIAL/PLATELET
BASOS ABS: 0 10*3/uL (ref 0.0–0.1)
Basophils Relative: 0 %
Eosinophils Absolute: 0.2 10*3/uL (ref 0.0–0.7)
Eosinophils Relative: 3 %
HEMATOCRIT: 43.2 % (ref 39.0–52.0)
HEMOGLOBIN: 15.6 g/dL (ref 13.0–17.0)
LYMPHS PCT: 29 %
Lymphs Abs: 1.8 10*3/uL (ref 0.7–4.0)
MCH: 31.4 pg (ref 26.0–34.0)
MCHC: 36.1 g/dL — AB (ref 30.0–36.0)
MCV: 86.9 fL (ref 78.0–100.0)
MONO ABS: 0.8 10*3/uL (ref 0.1–1.0)
MONOS PCT: 12 %
NEUTROS ABS: 3.6 10*3/uL (ref 1.7–7.7)
NEUTROS PCT: 56 %
Platelets: 253 10*3/uL (ref 150–400)
RBC: 4.97 MIL/uL (ref 4.22–5.81)
RDW: 11.9 % (ref 11.5–15.5)
WBC: 6.4 10*3/uL (ref 4.0–10.5)

## 2015-06-08 LAB — I-STAT ARTERIAL BLOOD GAS, ED
ACID-BASE EXCESS: 6 mmol/L — AB (ref 0.0–2.0)
BICARBONATE: 29.9 meq/L — AB (ref 20.0–24.0)
O2 SAT: 100 %
PO2 ART: 271 mmHg — AB (ref 80.0–100.0)
TCO2: 31 mmol/L (ref 0–100)
pCO2 arterial: 39.1 mmHg (ref 35.0–45.0)
pH, Arterial: 7.491 — ABNORMAL HIGH (ref 7.350–7.450)

## 2015-06-08 LAB — COMPREHENSIVE METABOLIC PANEL
ALK PHOS: 66 U/L (ref 38–126)
ALT: 49 U/L (ref 17–63)
AST: 64 U/L — AB (ref 15–41)
Albumin: 4.9 g/dL (ref 3.5–5.0)
Anion gap: 12 (ref 5–15)
BILIRUBIN TOTAL: 1 mg/dL (ref 0.3–1.2)
BUN: 7 mg/dL (ref 6–20)
CALCIUM: 9.4 mg/dL (ref 8.9–10.3)
CO2: 27 mmol/L (ref 22–32)
Chloride: 96 mmol/L — ABNORMAL LOW (ref 101–111)
Creatinine, Ser: 1 mg/dL (ref 0.61–1.24)
GFR calc Af Amer: >60 mL/min (ref >60)
GLUCOSE: 108 mg/dL — AB (ref 65–99)
POTASSIUM: 2.8 mmol/L — AB (ref 3.5–5.1)
Sodium: 135 mmol/L (ref 135–145)
TOTAL PROTEIN: 7.5 g/dL (ref 6.5–8.1)

## 2015-06-08 LAB — I-STAT CG4 LACTIC ACID, ED: Lactic Acid, Venous: 1.96 mmol/L (ref 0.5–2.0)

## 2015-06-08 LAB — LIPASE, BLOOD: LIPASE: 29 U/L (ref 22–51)

## 2015-06-08 MED ORDER — SODIUM CHLORIDE 0.9 % IV SOLN
INTRAVENOUS | Status: AC | PRN
  Administered 2015-06-08: 1000 mL via INTRAVENOUS

## 2015-06-08 MED ORDER — FENTANYL CITRATE (PF) 100 MCG/2ML IJ SOLN
INTRAMUSCULAR | Status: AC
  Filled 2015-06-08: qty 2

## 2015-06-08 MED ORDER — NALOXONE HCL 1 MG/ML IJ SOLN
INTRAMUSCULAR | Status: AC
  Filled 2015-06-08: qty 2

## 2015-06-08 MED ORDER — SUCCINYLCHOLINE CHLORIDE 20 MG/ML IJ SOLN
INTRAMUSCULAR | Status: AC
  Administered 2015-06-09: 23:00:00
  Filled 2015-06-08: qty 1

## 2015-06-08 MED ORDER — PROPOFOL 1000 MG/100ML IV EMUL
INTRAVENOUS | Status: DC | PRN
  Administered 2015-06-08: 10 ug/kg/min via INTRAVENOUS
  Administered 2015-06-09: 10 mg/h via INTRAVENOUS

## 2015-06-08 MED ORDER — NALOXONE HCL 1 MG/ML IJ SOLN
INTRAMUSCULAR | Status: AC | PRN
  Administered 2015-06-08: 2 mg via INTRAMUSCULAR

## 2015-06-08 MED ORDER — ETOMIDATE 2 MG/ML IV SOLN
INTRAVENOUS | Status: AC
  Filled 2015-06-08: qty 20

## 2015-06-08 MED ORDER — ROCURONIUM BROMIDE 50 MG/5ML IV SOLN
INTRAVENOUS | Status: AC
  Filled 2015-06-08: qty 2

## 2015-06-08 MED ORDER — MIDAZOLAM HCL 2 MG/2ML IJ SOLN
INTRAMUSCULAR | Status: AC
  Filled 2015-06-08: qty 2

## 2015-06-08 MED ORDER — PROPOFOL 1000 MG/100ML IV EMUL
INTRAVENOUS | Status: AC
  Filled 2015-06-08: qty 100

## 2015-06-08 MED ORDER — ETOMIDATE 2 MG/ML IV SOLN
INTRAVENOUS | Status: AC | PRN
  Administered 2015-06-08: 20 mg via INTRAVENOUS

## 2015-06-08 MED ORDER — LIDOCAINE HCL (CARDIAC) 20 MG/ML IV SOLN
INTRAVENOUS | Status: AC
  Administered 2015-06-09: 23:00:00
  Filled 2015-06-08: qty 5

## 2015-06-08 MED ORDER — FENTANYL CITRATE (PF) 100 MCG/2ML IJ SOLN
50.0000 ug | Freq: Once | INTRAMUSCULAR | Status: AC
  Administered 2015-06-08: 50 ug via INTRAVENOUS

## 2015-06-08 MED ORDER — PROPOFOL 1000 MG/100ML IV EMUL
5.0000 ug/kg/min | Freq: Once | INTRAVENOUS | Status: DC
Start: 2015-06-08 — End: 2015-06-09

## 2015-06-08 MED ORDER — ROCURONIUM BROMIDE 50 MG/5ML IV SOLN
INTRAVENOUS | Status: AC | PRN
  Administered 2015-06-08: 70 mg via INTRAVENOUS

## 2015-06-08 MED ORDER — MIDAZOLAM HCL 2 MG/2ML IJ SOLN
2.0000 mg | Freq: Once | INTRAMUSCULAR | Status: AC
  Administered 2015-06-08: 2 mg via INTRAVENOUS

## 2015-06-08 NOTE — ED Provider Notes (Signed)
CSN: [id number]     Arrival date & time 06/08/15  2221 History   First MD Initiated Contact with Patient 06/08/15 2239     No chief complaint on file.    (Consider location/radiation/quality/duration/timing/severity/associated sxs/prior Treatment) Patient is a 19 y.o. male presenting with altered mental status.  Altered Mental Status Presenting symptoms: unresponsiveness   Severity:  Severe Most recent episode:  Today Episode history:  Unable to specify Duration: unable to specify. Timing:  Constant Progression:  Unable to specify Chronicity:  New Context comment:  Unable to specify   No past medical history on file. No past surgical history on file. No family history on file. Social History  Substance Use Topics  . Smoking status: Not on file  . Smokeless tobacco: Not on file  . Alcohol Use: Not on file    Review of Systems  Unable to perform ROS: Patient unresponsive      Allergies  Review of patient's allergies indicates not on file.  Home Medications   Prior to Admission medications   Not on File   BP 142/61 mmHg  Pulse 126  Resp 23  Wt 141 lb (63.957 kg)  SpO2 100% Physical Exam  Constitutional:  Unresponsive to painful stimuli.  HENT:  Head: Normocephalic and atraumatic.  Eyes:  4mm pupils minimally reactive with rotational nystagmus.   Neck: Normal range of motion.  Cardiovascular:  Tachycardic   Pulmonary/Chest: No respiratory distress. He has no wheezes.  Abdominal: Soft. He exhibits no distension. There is no tenderness. There is no rebound.  Musculoskeletal: He exhibits no edema.  Neurological:  Unresponsive  Sporadic spastic movements of extremities.  Skin: Skin is warm and dry.  Old healing bruises to bilateral lower extremities.   No track marks  Psychiatric:  Unresponsive  Nursing note and vitals reviewed.   ED Course  .Intubation Date/Time: 06/09/2015 1:57 AM Performed by: Deirdre PeerGADDY, Ki Corbo Authorized by: Deirdre PeerGADDY,  Savina Olshefski Consent: The procedure was performed in an emergent situation. Patient identity confirmed: arm band Indications: respiratory failure and  airway protection Intubation method: direct Patient status: paralyzed (RSI) Sedatives: etomidate Paralytic: rocuronium Laryngoscope size: Miller 3 Tube size: 7.5 mm Tube type: cuffed Number of attempts: 1 Cords visualized: yes Post-procedure assessment: chest rise,  ETCO2 monitor and CO2 detector Breath sounds: equal and absent over the epigastrium Cuff inflated: yes ETT to lip: 23 cm ETT to teeth: 22 cm Tube secured with: ETT holder Chest x-ray interpreted by me. Chest x-ray findings: endotracheal tube in appropriate position Patient tolerance: Patient tolerated the procedure well with no immediate complications   (including critical care time) Labs Review Labs Reviewed  URINALYSIS, ROUTINE W REFLEX MICROSCOPIC (NOT AT [facility]) - Abnormal; Notable for the following:    APPearance CLOUDY (*)    All other components within normal limits  URINE RAPID DRUG SCREEN, HOSP PERFORMED - Abnormal; Notable for the following:    Benzodiazepines POSITIVE (*)    Amphetamines POSITIVE (*)    All other components within normal limits  CBC WITH DIFFERENTIAL/PLATELET - Abnormal; Notable for the following:    MCHC 36.1 (*)    All other components within normal limits  COMPREHENSIVE METABOLIC PANEL - Abnormal; Notable for the following:    Potassium 2.8 (*)    Chloride 96 (*)    Glucose, Bld 108 (*)    AST 64 (*)    All other components within normal limits  ACETAMINOPHEN LEVEL - Abnormal; Notable for the following:    Acetaminophen (Tylenol), Serum <10 (*)  All other components within normal limits  CK - Abnormal; Notable for the following:    Total CK 859 (*)    All other components within normal limits  I-STAT ARTERIAL BLOOD GAS, ED - Abnormal; Notable for the following:    pH, Arterial 7.491 (*)    pO2, Arterial 271.0 (*)    Bicarbonate  29.9 (*)    Acid-Base Excess 6.0 (*)    All other components within normal limits  LIPASE, BLOOD  SALICYLATE LEVEL  OSMOLALITY  CBC  BASIC METABOLIC PANEL  BLOOD GAS, ARTERIAL  MAGNESIUM  PHOSPHORUS  TROPONIN I  CK  FOLATE  TRIGLYCERIDES  ETHANOL  I-STAT CG4 LACTIC ACID, ED    Imaging Review Ct Head Wo Contrast  06/09/2015  CLINICAL DATA:  Dropped off at hospital, unresponsive. Initial encounter. EXAM: CT HEAD WITHOUT CONTRAST TECHNIQUE: Contiguous axial images were obtained from the base of the skull through the vertex without intravenous contrast. COMPARISON:  None. FINDINGS: There is no evidence of acute infarction, mass lesion, or intra- or extra-axial hemorrhage on CT. The posterior fossa, including the cerebellum, brainstem and fourth ventricle, is within normal limits. The third and lateral ventricles, and basal ganglia are unremarkable in appearance. The cerebral hemispheres are symmetric in appearance, with normal gray-white differentiation. No mass effect or midline shift is seen. There is no evidence of fracture; visualized osseous structures are unremarkable in appearance. The visualized portions of the orbits are within normal limits. The paranasal sinuses and mastoid air cells are well-aerated. No significant soft tissue abnormalities are seen. IMPRESSION: Unremarkable noncontrast CT of the head. Electronically Signed   By: Roanna Raider M.D.   On: 06/09/2015 00:23   Dg Chest Portable 1 View  06/09/2015  CLINICAL DATA:  Endotracheal tube placement.  Found unresponsive. EXAM: PORTABLE CHEST 1 VIEW COMPARISON:  None. FINDINGS: The endotracheal tube is 3.3 cm from the carina. Lung volumes are low. The cardiomediastinal contours are normal. Pulmonary vasculature is normal. No consolidation, pleural effusion, or pneumothorax. No acute osseous abnormalities are seen. IMPRESSION: 1. Endotracheal tube 3.3 cm from the carina. 2. Hypo aerated but clear lungs. Electronically Signed    By: Rubye Oaks M.D.   On: 06/09/2015 00:01   I have personally reviewed and evaluated these images and lab results as part of my medical decision-making.   EKG Interpretation   Date/Time:  Sunday June 08 2015 22:30:35 EDT Ventricular Rate:  126 PR Interval:  175 QRS Duration: 98 QT Interval:  307 QTC Calculation: 444 R Axis:   94 Text Interpretation:  Sinus tachycardia Borderline right axis deviation  Borderline T wave abnormalities Borderline ST elevation, anterior leads No  previous tracing Confirmed by POLLINA  MD, CHRISTOPHER [phone]) on  06/08/2015 11:37:31 PM      MDM   Patient presents emergency department today after being dropped off emergency department by an unknown person. He had no response to painful stimuli. He has rotational nystagmus, signs of old trauma but nothing acute. Patient reportedly had a previous history of behavioral health problems. Concerns for potential overdose versus trauma.  After patient was intubated. Nursing attempted to OG placement and subsequently perforated ET tube cuff. Patient was reintubated by myself without complications.   Patient intubated to control airway and placed on propofol in case he is having subclinical seizure like activity. ABG shows slight alkalosis. No toxic alcohol ingestion suspected at this time. He continues to be hypertensive and tachycardic and more likely a toxidrome given his rotational nystagmus and  altered mental status. CT is unremarkable. Patient will be admitted with neurology consulted given his spastic movements of his extremities. Patient admitted for further management to critical care.   Final diagnoses:  Glasgow coma scale total score 3-8, unspecified time [facility])  Compromised airway       Deirdre Peer, MD 06/09/15 1610  Deirdre Peer, MD 06/10/15 1248  Gilda Crease, MD 06/11/15 [phone]

## 2015-06-08 NOTE — ED Notes (Signed)
Pt transported to CT ?

## 2015-06-08 NOTE — ED Notes (Signed)
Patient transported to CT and back to Trauma A on ventilator with no complications.

## 2015-06-08 NOTE — ED Notes (Signed)
[facility] pt un[facility], pulses present

## 2015-06-08 NOTE — ED Provider Notes (Addendum)
Patient was reportedly dropped off outside the hospital. Patient was unresponsive when found. No further information possible.   Face to face Exam: HEENT - no obvious trauma, pupils are small, minimally reactive and eyes are  Lungs - CTAB Heart - RRR, no M/R/G Abd - S/NT/ND Neuro - unresponsive  Plan: Patient unresponsive, exhibiting twitching and shaking of all of his extremities. Patient intubated for airway protection. CT head performed to rule out intracranial bleed or injury. Suspect overdose or toxidrome. Will require admission to ICU.  CRITICAL CARE Performed by: Gilda CreasePOLLINA, CHRISTOPHER J.   Total critical care time: 35min  Critical care time was exclusive of separately billable procedures and treating other patients.  Critical care was necessary to treat or prevent imminent or life-threatening deterioration.  Critical care was time spent personally by me on the following activities: development of treatment plan with patient and/or surrogate as well as nursing, discussions with consultants, evaluation of patient's response to treatment, examination of patient, obtaining history from patient or surrogate, ordering and performing treatments and interventions, ordering and review of laboratory studies, ordering and review of radiographic studies, pulse oximetry and re-evaluation of patient's condition.   Gilda Creasehristopher J Pollina, MD 06/08/15 [phone]  Gilda Creasehristopher J Pollina, MD 06/11/15 [phone]

## 2015-06-08 NOTE — ED Notes (Signed)
Respiratory completing arterial stick, phlebotomy completing venous stick and EMT completing temp foley at bedside.

## 2015-06-08 NOTE — ED Notes (Signed)
OG attempted x3 by Wilford Sportscassie RN and woody RN without success

## 2015-06-08 NOTE — Significant Event (Signed)
Rapid Response Event Note  Overview: Time Called: 2212 Arrival Time: 2214 Event Type: Neurologic  Initial Focused Assessment: Patient unresponsive sitting in wheelchair.    Interventions: Patient was taken to the emergency department from evaluation   Event Summary:  [place] [facility] [place] person. Patient was unresponsive moderate stimuli. Airway was patent and shallow respiration. Skin was warm and dry. Patient was placed in a wheelchair and transported to the ED for evaluation.     [place]          [place]

## 2015-06-09 ENCOUNTER — Inpatient Hospital Stay (HOSPITAL_COMMUNITY): Payer: Medicaid Other

## 2015-06-09 DIAGNOSIS — R4182 Altered mental status, unspecified: Secondary | ICD-10-CM | POA: Diagnosis present

## 2015-06-09 DIAGNOSIS — J9600 Acute respiratory failure, unspecified whether with hypoxia or hypercapnia: Secondary | ICD-10-CM | POA: Diagnosis present

## 2015-06-09 DIAGNOSIS — R45851 Suicidal ideations: Secondary | ICD-10-CM | POA: Diagnosis not present

## 2015-06-09 DIAGNOSIS — F17200 Nicotine dependence, unspecified, uncomplicated: Secondary | ICD-10-CM | POA: Diagnosis present

## 2015-06-09 DIAGNOSIS — R402432 Glasgow coma scale score 3-8, at arrival to emergency department: Secondary | ICD-10-CM

## 2015-06-09 DIAGNOSIS — G9340 Encephalopathy, unspecified: Secondary | ICD-10-CM | POA: Diagnosis not present

## 2015-06-09 DIAGNOSIS — T1491 Suicide attempt: Secondary | ICD-10-CM | POA: Diagnosis not present

## 2015-06-09 DIAGNOSIS — I5181 Takotsubo syndrome: Secondary | ICD-10-CM | POA: Diagnosis present

## 2015-06-09 DIAGNOSIS — F329 Major depressive disorder, single episode, unspecified: Secondary | ICD-10-CM | POA: Diagnosis present

## 2015-06-09 DIAGNOSIS — F1520 Other stimulant dependence, uncomplicated: Secondary | ICD-10-CM | POA: Diagnosis not present

## 2015-06-09 DIAGNOSIS — J15211 Pneumonia due to Methicillin susceptible Staphylococcus aureus: Secondary | ICD-10-CM | POA: Diagnosis not present

## 2015-06-09 DIAGNOSIS — R4020 Unspecified coma: Secondary | ICD-10-CM | POA: Diagnosis present

## 2015-06-09 DIAGNOSIS — E876 Hypokalemia: Secondary | ICD-10-CM | POA: Diagnosis present

## 2015-06-09 DIAGNOSIS — Y848 Other medical procedures as the cause of abnormal reaction of the patient, or of later complication, without mention of misadventure at the time of the procedure: Secondary | ICD-10-CM | POA: Diagnosis not present

## 2015-06-09 DIAGNOSIS — Z23 Encounter for immunization: Secondary | ICD-10-CM | POA: Diagnosis not present

## 2015-06-09 DIAGNOSIS — T50992A Poisoning by other drugs, medicaments and biological substances, intentional self-harm, initial encounter: Secondary | ICD-10-CM | POA: Diagnosis present

## 2015-06-09 DIAGNOSIS — I248 Other forms of acute ischemic heart disease: Secondary | ICD-10-CM | POA: Diagnosis present

## 2015-06-09 DIAGNOSIS — Y95 Nosocomial condition: Secondary | ICD-10-CM | POA: Diagnosis not present

## 2015-06-09 DIAGNOSIS — I519 Heart disease, unspecified: Secondary | ICD-10-CM | POA: Diagnosis not present

## 2015-06-09 DIAGNOSIS — F332 Major depressive disorder, recurrent severe without psychotic features: Secondary | ICD-10-CM | POA: Diagnosis not present

## 2015-06-09 DIAGNOSIS — G92 Toxic encephalopathy: Secondary | ICD-10-CM | POA: Diagnosis present

## 2015-06-09 DIAGNOSIS — F410 Panic disorder [episodic paroxysmal anxiety] without agoraphobia: Secondary | ICD-10-CM | POA: Diagnosis not present

## 2015-06-09 DIAGNOSIS — J9601 Acute respiratory failure with hypoxia: Secondary | ICD-10-CM

## 2015-06-09 DIAGNOSIS — J95851 Ventilator associated pneumonia: Secondary | ICD-10-CM | POA: Diagnosis not present

## 2015-06-09 DIAGNOSIS — F419 Anxiety disorder, unspecified: Secondary | ICD-10-CM | POA: Diagnosis present

## 2015-06-09 DIAGNOSIS — A6000 Herpesviral infection of urogenital system, unspecified: Secondary | ICD-10-CM | POA: Diagnosis present

## 2015-06-09 DIAGNOSIS — R0689 Other abnormalities of breathing: Secondary | ICD-10-CM | POA: Diagnosis not present

## 2015-06-09 DIAGNOSIS — F1510 Other stimulant abuse, uncomplicated: Secondary | ICD-10-CM | POA: Diagnosis not present

## 2015-06-09 DIAGNOSIS — T424X2A Poisoning by benzodiazepines, intentional self-harm, initial encounter: Secondary | ICD-10-CM | POA: Diagnosis not present

## 2015-06-09 DIAGNOSIS — F1994 Other psychoactive substance use, unspecified with psychoactive substance-induced mood disorder: Secondary | ICD-10-CM | POA: Diagnosis not present

## 2015-06-09 DIAGNOSIS — I5020 Unspecified systolic (congestive) heart failure: Secondary | ICD-10-CM | POA: Diagnosis present

## 2015-06-09 DIAGNOSIS — D649 Anemia, unspecified: Secondary | ICD-10-CM | POA: Diagnosis present

## 2015-06-09 DIAGNOSIS — I429 Cardiomyopathy, unspecified: Secondary | ICD-10-CM | POA: Diagnosis not present

## 2015-06-09 DIAGNOSIS — I110 Hypertensive heart disease with heart failure: Secondary | ICD-10-CM | POA: Diagnosis present

## 2015-06-09 LAB — BASIC METABOLIC PANEL
Anion gap: 12 (ref 5–15)
Anion gap: 7 (ref 5–15)
BUN: 5 mg/dL — ABNORMAL LOW (ref 6–20)
BUN: <5 mg/dL — ABNORMAL LOW (ref 6–20)
CALCIUM: 8.7 mg/dL — AB (ref 8.9–10.3)
CALCIUM: 8.8 mg/dL — AB (ref 8.9–10.3)
CHLORIDE: 100 mmol/L — AB (ref 101–111)
CO2: 26 mmol/L (ref 22–32)
CO2: 24 mmol/L (ref 22–32)
CREATININE: 0.81 mg/dL (ref 0.61–1.24)
CREATININE: 0.82 mg/dL (ref 0.61–1.24)
Chloride: 110 mmol/L (ref 101–111)
GFR calc non Af Amer: >60 mL/min (ref >60)
GFR calc non Af Amer: >60 mL/min (ref >60)
Glucose, Bld: 91 mg/dL (ref 65–99)
Glucose, Bld: 97 mg/dL (ref 65–99)
Potassium: 2.7 mmol/L — CL (ref 3.5–5.1)
Potassium: 3.6 mmol/L (ref 3.5–5.1)
SODIUM: 138 mmol/L (ref 135–145)
SODIUM: 141 mmol/L (ref 135–145)

## 2015-06-09 LAB — URINALYSIS, ROUTINE W REFLEX MICROSCOPIC
BILIRUBIN URINE: NEGATIVE
GLUCOSE, UA: NEGATIVE mg/dL
HGB URINE DIPSTICK: NEGATIVE
KETONES UR: NEGATIVE mg/dL
LEUKOCYTES UA: NEGATIVE
Nitrite: NEGATIVE
PH: 6.5 (ref 5.0–8.0)
PROTEIN: NEGATIVE mg/dL
Specific Gravity, Urine: 1.01 (ref 1.005–1.030)
Urobilinogen, UA: 0.2 mg/dL (ref 0.0–1.0)

## 2015-06-09 LAB — TROPONIN I
TROPONIN I: 0.43 ng/mL — AB (ref <0.031)
TROPONIN I: 0.34 ng/mL — AB (ref <0.031)
Troponin I: 0.56 ng/mL
Troponin I: 0.2 ng/mL — ABNORMAL HIGH

## 2015-06-09 LAB — CBC
HCT: 35.9 % — ABNORMAL LOW (ref 39.0–52.0)
HEMOGLOBIN: 13.1 g/dL (ref 13.0–17.0)
MCH: 31.6 pg (ref 26.0–34.0)
MCHC: 36.5 g/dL — AB (ref 30.0–36.0)
MCV: 86.7 fL (ref 78.0–100.0)
Platelets: 242 10*3/uL (ref 150–400)
RBC: 4.14 MIL/uL — ABNORMAL LOW (ref 4.22–5.81)
RDW: 11.8 % (ref 11.5–15.5)
WBC: 6.4 10*3/uL (ref 4.0–10.5)

## 2015-06-09 LAB — GLUCOSE, CAPILLARY
Glucose-Capillary: 105 mg/dL — ABNORMAL HIGH (ref 65–99)
Glucose-Capillary: 96 mg/dL (ref 65–99)

## 2015-06-09 LAB — CK
CK TOTAL: 771 U/L — AB (ref 49–397)
CK TOTAL: 859 U/L — AB (ref 49–397)

## 2015-06-09 LAB — BLOOD GAS, ARTERIAL
Acid-Base Excess: 2.1 mmol/L — ABNORMAL HIGH (ref 0.0–2.0)
BICARBONATE: 25 meq/L — AB (ref 20.0–24.0)
Drawn by: 419771
FIO2: 0.4
O2 SAT: 99.6 %
PEEP: 5 cmH2O
Patient temperature: 98.6
RATE: 14 resp/min
TCO2: 25.9 mmol/L (ref 0–100)
VT: 530 mL
pCO2 arterial: 31.3 mmHg — ABNORMAL LOW (ref 35.0–45.0)
pH, Arterial: 7.513 — ABNORMAL HIGH (ref 7.350–7.450)
pO2, Arterial: 208 mmHg — ABNORMAL HIGH (ref 80.0–100.0)

## 2015-06-09 LAB — ETHANOL: Alcohol, Ethyl (B): <5 mg/dL (ref <5)

## 2015-06-09 LAB — RAPID URINE DRUG SCREEN, HOSP PERFORMED
Amphetamines: POSITIVE — AB
BENZODIAZEPINES: POSITIVE — AB
Barbiturates: NOT DETECTED
Cocaine: NOT DETECTED
Opiates: NOT DETECTED
Tetrahydrocannabinol: NOT DETECTED

## 2015-06-09 LAB — OSMOLALITY: OSMOLALITY: 281 mosm/kg (ref 275–300)

## 2015-06-09 LAB — TRIGLYCERIDES: TRIGLYCERIDES: 62 mg/dL (ref <150)

## 2015-06-09 LAB — FOLATE: FOLATE: 14.1 ng/mL (ref >5.9)

## 2015-06-09 LAB — PHOSPHORUS: PHOSPHORUS: 4 mg/dL (ref 2.5–4.6)

## 2015-06-09 LAB — MRSA PCR SCREENING: MRSA by PCR: NEGATIVE

## 2015-06-09 LAB — SALICYLATE LEVEL

## 2015-06-09 LAB — MAGNESIUM: Magnesium: 2 mg/dL (ref 1.7–2.4)

## 2015-06-09 LAB — ACETAMINOPHEN LEVEL

## 2015-06-09 MED ORDER — THIAMINE HCL 100 MG/ML IJ SOLN
100.0000 mg | Freq: Every day | INTRAMUSCULAR | Status: DC
  Administered 2015-06-09 – 2015-06-11 (×3): 100 mg via INTRAVENOUS
  Filled 2015-06-09 (×5): qty 1

## 2015-06-09 MED ORDER — POTASSIUM CHLORIDE 20 MEQ/15ML (10%) PO SOLN
80.0000 meq | Freq: Once | ORAL | Status: AC
  Administered 2015-06-09: 80 meq
  Filled 2015-06-09: qty 60

## 2015-06-09 MED ORDER — SODIUM CHLORIDE 0.9 % IV SOLN: 250.0000 mL | INTRAVENOUS | Status: DC | PRN

## 2015-06-09 MED ORDER — HEPARIN SODIUM (PORCINE) 5000 UNIT/ML IJ SOLN
5000.0000 [IU] | Freq: Three times a day (TID) | INTRAMUSCULAR | Status: DC
  Administered 2015-06-09 – 2015-06-13 (×13): 5000 [IU] via SUBCUTANEOUS
  Filled 2015-06-09 (×16): qty 1

## 2015-06-09 MED ORDER — VITAL AF 1.2 CAL PO LIQD
1000.0000 mL | ORAL | Status: DC
  Administered 2015-06-09: 25 mL
  Administered 2015-06-11: 1000 mL
  Filled 2015-06-09 (×6): qty 1000

## 2015-06-09 MED ORDER — PANTOPRAZOLE SODIUM 40 MG IV SOLR
40.0000 mg | Freq: Every day | INTRAVENOUS | Status: DC
  Administered 2015-06-09 – 2015-06-11 (×4): 40 mg via INTRAVENOUS
  Filled 2015-06-09 (×5): qty 40

## 2015-06-09 MED ORDER — FENTANYL CITRATE (PF) 100 MCG/2ML IJ SOLN
100.0000 ug | INTRAMUSCULAR | Status: DC | PRN
  Filled 2015-06-09: qty 2

## 2015-06-09 MED ORDER — FENTANYL CITRATE (PF) 100 MCG/2ML IJ SOLN
100.0000 ug | INTRAMUSCULAR | Status: DC | PRN
  Administered 2015-06-09 – 2015-06-11 (×3): 100 ug via INTRAVENOUS
  Filled 2015-06-09 (×4): qty 2

## 2015-06-09 MED ORDER — ANTISEPTIC ORAL RINSE SOLUTION (CORINZ)
7.0000 mL | Freq: Four times a day (QID) | OROMUCOSAL | Status: DC
  Administered 2015-06-09 – 2015-06-11 (×11): 7 mL via OROMUCOSAL

## 2015-06-09 MED ORDER — SODIUM CHLORIDE 0.9 % IV SOLN
INTRAVENOUS | Status: DC
  Administered 2015-06-09: 03:00:00 via INTRAVENOUS
  Administered 2015-06-09 (×2): 100 mL/h via INTRAVENOUS
  Administered 2015-06-10 – 2015-06-11 (×2): via INTRAVENOUS

## 2015-06-09 MED ORDER — SODIUM CHLORIDE 0.9 % IV BOLUS (SEPSIS)
1000.0000 mL | Freq: Once | INTRAVENOUS | Status: AC
  Administered 2015-06-09: 1000 mL via INTRAVENOUS

## 2015-06-09 MED ORDER — PROPOFOL 1000 MG/100ML IV EMUL
0.0000 ug/kg/min | INTRAVENOUS | Status: DC
  Administered 2015-06-09: 10 ug/kg/min via INTRAVENOUS
  Administered 2015-06-09 (×2): 30 ug/kg/min via INTRAVENOUS
  Administered 2015-06-10 (×3): 45 ug/kg/min via INTRAVENOUS
  Administered 2015-06-11: 20 ug/kg/min via INTRAVENOUS
  Filled 2015-06-09 (×7): qty 100

## 2015-06-09 MED ORDER — PRO-STAT SUGAR FREE PO LIQD
30.0000 mL | Freq: Two times a day (BID) | ORAL | Status: AC
  Administered 2015-06-09 (×2): 30 mL
  Filled 2015-06-09 (×2): qty 30

## 2015-06-09 MED ORDER — CHLORHEXIDINE GLUCONATE 0.12% ORAL RINSE (MEDLINE KIT)
15.0000 mL | Freq: Two times a day (BID) | OROMUCOSAL | Status: DC
  Administered 2015-06-09 – 2015-06-11 (×5): 15 mL via OROMUCOSAL

## 2015-06-09 NOTE — ED Notes (Signed)
Dr Thad Rangerreynolds at the bedside

## 2015-06-09 NOTE — Consult Note (Signed)
Reason for Consult:Altered mental status Referring Physician: Byrum  CC: Altered mental status  HPI: Johnny Fuller is an 19 y.o. male who is currently unresponsive and intubated.  No family or friends present.  All history obtained from the chart.  Patient was dropped of at [facility] entrance by an unknown person.  Patient was unresponsive.  Was placed in a wheelchair and brought to the ED.  In ED was intubated and now on Propofol.  Some involuntary movements noted.  Consult called for further evaluation.    Past medical history: Unknown  Past surgical history: Unknown.  Family history: Unable to obtain due to mental status  Social History:  has no tobacco, alcohol, and drug history on file.  Allergies not on file  Medications: Unknown  ROS: Unable to obtain due to mental status  Physical Examination: Blood pressure 124/80, pulse 92, temperature 98.1 F (36.7 C), temperature source Rectal, resp. rate 14, height  (1.702 m), weight 63.957 kg (141 lb), SpO2 100 %.  HEENT-  Normocephalic, no lesions, without obvious abnormality.  Normal external eye and conjunctiva.  Normal TM's bilaterally.  Normal auditory canals and external ears. Normal external nose, mucus membranes and septum.  Normal pharynx. Cardiovascular- S1, S2 normal, pulses palpable throughout   Lungs- chest clear, no wheezing, rales, normal symmetric air entry Abdomen- soft, non-tender; bowel sounds normal; no masses,  no organomegaly Extremities- no edema Lymph-no adenopathy palpable Musculoskeletal-no joint tenderness, deformity or swelling Skin-extensive bruising throughout on extremities.  Neurological Examination Mental Status: Patient does not respond to verbal stimuli.  Does not respond to deep sternal rub.  Does not follow commands.  No verbalizations noted.  Cranial Nerves: II: patient does not respond confrontation bilaterally, pupils right 3 mm, left 3 mm,and minimally reactive  bilaterally III,IV,VI: doll's response absent bilaterally. When diprovan held some response to oculocephalic maneuvers noted. V,VII: corneal reflex absent bilaterally  VIII: patient does not respond to verbal stimuli IX,X: gag reflex reduced, XI: trapezius strength unable to test bilaterally XII: tongue strength unable to test Motor: Extremities flaccid throughout.  When Diprovan held patient with some movement noted in all extremities.  No focal weakness noted.   Sensory: Does not respond to noxious stimuli in any extremity. Deep Tendon Reflexes:  Absent throughout. Plantars: mute bilaterally Cerebellar: Unable to perform      Laboratory Studies:   Basic Metabolic Panel:  Recent Labs Lab 06/08/15 2231  NA 135  K 2.8*  CL 96*  CO2 27  GLUCOSE 108*  BUN 7  CREATININE 1.00  CALCIUM 9.4    Liver Function Tests:  Recent Labs Lab 06/08/15 2231  AST 64*  ALT 49  ALKPHOS 66  BILITOT 1.0  PROT 7.5  ALBUMIN 4.9    Recent Labs Lab 06/08/15 2231  LIPASE 29   No results for input(s): AMMONIA in the last 168 hours.  CBC:  Recent Labs Lab 06/08/15 2231  WBC 6.4  NEUTROABS 3.6  HGB 15.6  HCT 43.2  MCV 86.9  PLT 253    Cardiac Enzymes:  Recent Labs Lab 06/08/15 2336  CKTOTAL 859*    BNP: Invalid input(s): POCBNP  CBG: No results for input(s): GLUCAP in the last 168 hours.  Microbiology: No results found for this or any previous visit.  Coagulation Studies: No results for input(s): LABPROT, INR in the last 72 hours.  Urinalysis:  Recent Labs Lab 06/08/15 2351  COLORURINE YELLOW  LABSPEC 1.010  PHURINE 6.5  GLUCOSEU NEGATIVE  HGBUR NEGATIVE  BILIRUBINUR NEGATIVE  KETONESUR NEGATIVE  PROTEINUR NEGATIVE  UROBILINOGEN 0.2  NITRITE NEGATIVE  LEUKOCYTESUR NEGATIVE    Lipid Panel:  No results found for: CHOL, TRIG, HDL, CHOLHDL, VLDL, LDLCALC  HgbA1C: No results found for: HGBA1C  Urine Drug Screen:     Component Value Date/Time    LABOPIA NONE DETECTED 06/08/2015 2351   COCAINSCRNUR NONE DETECTED 06/08/2015 2351   LABBENZ POSITIVE* 06/08/2015 2351   AMPHETMU POSITIVE* 06/08/2015 2351   THCU NONE DETECTED 06/08/2015 2351   LABBARB NONE DETECTED 06/08/2015 2351    Alcohol Level: No results for input(s): ETH in the last 168 hours.  Other results: EKG: sinus tachycardia at 126 bpm.  Imaging: Ct Head Wo Contrast  06/09/2015  CLINICAL DATA:  Dropped off at hospital, unresponsive. Initial encounter. EXAM: CT HEAD WITHOUT CONTRAST TECHNIQUE: Contiguous axial images were obtained from the base of the skull through the vertex without intravenous contrast. COMPARISON:  None. FINDINGS: There is no evidence of acute infarction, mass lesion, or intra- or extra-axial hemorrhage on CT. The posterior fossa, including the cerebellum, brainstem and fourth ventricle, is within normal limits. The third and lateral ventricles, and basal ganglia are unremarkable in appearance. The cerebral hemispheres are symmetric in appearance, with normal gray-white differentiation. No mass effect or midline shift is seen. There is no evidence of fracture; visualized osseous structures are unremarkable in appearance. The visualized portions of the orbits are within normal limits. The paranasal sinuses and mastoid air cells are well-aerated. No significant soft tissue abnormalities are seen. IMPRESSION: Unremarkable noncontrast CT of the head. Electronically Signed   By: Roanna RaiderJeffery  Chang M.D.   On: 06/09/2015 00:23   Dg Chest Portable 1 View  06/09/2015  CLINICAL DATA:  Endotracheal tube placement.  Found unresponsive. EXAM: PORTABLE CHEST 1 VIEW COMPARISON:  None. FINDINGS: The endotracheal tube is 3.3 cm from the carina. Lung volumes are low. The cardiomediastinal contours are normal. Pulmonary vasculature is normal. No consolidation, pleural effusion, or pneumothorax. No acute osseous abnormalities are seen. IMPRESSION: 1. Endotracheal tube 3.3 cm from the  carina. 2. Hypo aerated but clear lungs. Electronically Signed   By: Rubye OaksMelanie  Ehinger M.D.   On: 06/09/2015 00:01     Assessment/Plan: 19 year old male brought to the hospital unresponsive.  No history provided.  Patient now intubated and sedated with Propofol.  No focality noted on examination at this time even with holding Diprovan.  Suspect ingestion/drug use.  Drug screen positive for amphetamines and benzodiazepines.  No evidence of infection.  Head CT personally reviewed and shows no acute changes.    Recommendations: 1.  ETOH level 2.  EEG 3.  Will continue to follow with you  Thana FarrLeslie Fremont Skalicky, MD [facility] [phone] 06/09/2015, 1:16 AM

## 2015-06-09 NOTE — ED Notes (Signed)
Pt getting agitated moving both arms.  Bolus of propofol 10 mg

## 2015-06-09 NOTE — Progress Notes (Signed)
CRITICAL VALUE ALERT  Critical value received:  troponon 0.56  Date of notification:  06/09/2015  Time of notification:  09:50  Critical value read back  Nurse who received alert:  Acey LavPhyllis Kolby Myung  MD notified (1st page):  [email or web address]  Time of first page:  09:51  MD notified (2nd page):  Time of second page:  Responding MD: Marchelle Gearingramaswamy  Time MD responded:  [phone] at bedside

## 2015-06-09 NOTE — Progress Notes (Signed)
eLink Physician-Brief Progress Note Patient Name: Johnny Fuller DOB: Apr 07, 1996 MRN: [id number]   Date of Service  06/09/2015  HPI/Events of Note  No evidence of osmolar gap. Trop I elevated.    eICU Interventions  TTE     Intervention Category Minor Interventions: Clinical assessment - ordering diagnostic tests  [name] 06/09/2015, 3:41 AM

## 2015-06-09 NOTE — Progress Notes (Signed)
Bedside EEG completed, results pending. 

## 2015-06-09 NOTE — Progress Notes (Signed)
Attempt to call patients mother on chart to inform of patient's admission no answer and no no voicemail available. Will try again later

## 2015-06-09 NOTE — Progress Notes (Signed)
eLink Physician-Brief Progress Note Patient Name: Johnny Fuller DOB: 02-12-1996 MRN: [id number]   Date of Service  06/09/2015  HPI/Events of Note  RN questioned replacing OGT.  eICU Interventions  Requested OGT to be placed. Portable KUB ordered to confirm placement.     Intervention Category Minor Interventions: Communication with other healthcare providers and/or family  Lawanda CousinsJennings Hester Forget 06/09/2015, 2:01 AM

## 2015-06-09 NOTE — Progress Notes (Signed)
PULMONARY / CRITICAL CARE MEDICINE   Name: Johnny Fuller MRN: [id number] DOB: 1996-07-28    ADMISSION DATE:  06/08/2015 CONSULTATION DATE:  06/09/2015  REFERRING MD :  EDP  CHIEF COMPLAINT:  Unresponsive  INITIAL PRESENTATION: 19 y.o. M dropped off outside of hospital night of 06/08/15. In ED, had GCS of 3 therefore intubated for airway protection. PCCM called for admission of suspected overdose. Of note, [facility] for suicidal ideation.  STUDIES:  CXR 10/17 >>> neg CT head 10/17 >>> neg UDS: Amphetamines and benzos  SIGNIFICANT EVENTS: 10/17 - Admit  SUBJECTIVE:  Intubated, sedated. Arouses to physical stimuli.   VITAL SIGNS: Temp:  [98.1 F (36.7 C)-98.4 F (36.9 C)] 98.4 F (36.9 C) (10/17 0752) Pulse Rate:  [86-137] 89 (10/17 0600) Resp:  [14-26] 14 (10/17 0600) BP: (94-166)/(61-123) 118/83 mmHg (10/17 0600) SpO2:  [99 %-100 %] 100 % (10/17 0600) FiO2 (%):  [30 %-100 %] 30 % (10/17 0600) Weight:  [131 lb 13.4 oz (59.8 kg)-141 lb (63.957 kg)] 131 lb 13.4 oz (59.8 kg) (10/17 0200) HEMODYNAMICS:   VENTILATOR SETTINGS: Vent Mode:  [-] PRVC FiO2 (%):  [30 %-100 %] 30 % Set Rate:  [14 bmp] 14 bmp Vt Set:  [500 mL-530 mL] 530 mL PEEP:  [5 cmH20] 5 cmH20 Plateau Pressure:  [15 cmH20-16 cmH20] 16 cmH20 INTAKE / OUTPUT:  Intake/Output Summary (Last 24 hours) at 06/09/15 0752 Last data filed at 06/09/15 0600  Gross per 24 hour  Intake 550.16 ml  Output    745 ml  Net -194.84 ml    PHYSICAL EXAMINATION: General:  18yo male, intubated and sedated Neuro:  Arouses to physical stimuli, does not follow commands HEENT:  NCAT Cardiovascular:  RRR, s1s2 normal Lungs:  Clear bilaterally Abdomen:  +BS, S, NT, ND Musculoskeletal:  No cyanosis or clubbing Skin:  Intact, dry  LABS:  CBC  Recent Labs Lab 06/08/15 2231 06/09/15 0236  WBC 6.4 6.4  HGB 15.6 13.1  HCT 43.2 35.9*  PLT 253 242   Coag's No results for input(s): APTT, INR in the  last 168 hours. BMET  Recent Labs Lab 06/08/15 2231 06/09/15 0236  NA 135 138  K 2.8* 2.7*  CL 96* 100*  CO2 27 26  BUN 7 5*  CREATININE 1.00 0.81  GLUCOSE 108* 91   Electrolytes  Recent Labs Lab 06/08/15 2231 06/09/15 0236  CALCIUM 9.4 8.7*  MG  --  2.0  PHOS  --  4.0   Sepsis Markers  Recent Labs Lab 06/08/15 2258  LATICACIDVEN 1.96   ABG  Recent Labs Lab 06/08/15 2344 06/09/15 0424  PHART 7.491* 7.513*  PCO2ART 39.1 31.3*  PO2ART 271.0* 208*   Liver Enzymes  Recent Labs Lab 06/08/15 2231  AST 64*  ALT 49  ALKPHOS 66  BILITOT 1.0  ALBUMIN 4.9   Cardiac Enzymes  Recent Labs Lab 06/09/15 0236  TROPONINI 0.43*   Glucose No results for input(s): GLUCAP in the last 168 hours.  Imaging Ct Head Wo Contrast  06/09/2015  CLINICAL DATA:  Dropped off at hospital, unresponsive. Initial encounter. EXAM: CT HEAD WITHOUT CONTRAST TECHNIQUE: Contiguous axial images were obtained from the base of the skull through the vertex without intravenous contrast. COMPARISON:  None. FINDINGS: There is no evidence of acute infarction, mass lesion, or intra- or extra-axial hemorrhage on CT. The posterior fossa, including the cerebellum, brainstem and fourth ventricle, is within normal limits. The third and lateral ventricles, and basal  ganglia are unremarkable in appearance. The cerebral hemispheres are symmetric in appearance, with normal gray-white differentiation. No mass effect or midline shift is seen. There is no evidence of fracture; visualized osseous structures are unremarkable in appearance. The visualized portions of the orbits are within normal limits. The paranasal sinuses and mastoid air cells are well-aerated. No significant soft tissue abnormalities are seen. IMPRESSION: Unremarkable noncontrast CT of the head. Electronically Signed   By: Roanna Raider M.D.   On: 06/09/2015 00:23   Dg Chest Portable 1 View  06/09/2015  CLINICAL DATA:  Endotracheal tube  placement.  Found unresponsive. EXAM: PORTABLE CHEST 1 VIEW COMPARISON:  None. FINDINGS: The endotracheal tube is 3.3 cm from the carina. Lung volumes are low. The cardiomediastinal contours are normal. Pulmonary vasculature is normal. No consolidation, pleural effusion, or pneumothorax. No acute osseous abnormalities are seen. IMPRESSION: 1. Endotracheal tube 3.3 cm from the carina. 2. Hypo aerated but clear lungs. Electronically Signed   By: Rubye Oaks M.D.   On: 06/09/2015 00:01   Dg Abd Portable 1v  06/09/2015  CLINICAL DATA:  Enteric tube placement.  Initial encounter. EXAM: PORTABLE ABDOMEN - 1 VIEW COMPARISON:  None. FINDINGS: The patient's enteric tube is seen ending overlying the body of the stomach. The visualized bowel gas pattern is unremarkable. Scattered air and stool filled loops of colon are seen; no abnormal dilatation of small bowel loops is seen to suggest small bowel obstruction. No free intra-abdominal air is identified, though evaluation for free air is limited on a single supine view. The visualized osseous structures are within normal limits; the sacroiliac joints are unremarkable in appearance. The visualized lung bases are essentially clear. IMPRESSION: Enteric tube seen ending overlying the body of the stomach. Electronically Signed   By: Roanna Raider M.D.   On: 06/09/2015 04:08     ASSESSMENT / PLAN:   NEUROLOGIC A:  Acute encephalopathy - likely due to overdose; UDS positive for amphetamines and benzo's. Recent ED presentation and [facility] admission due to suicidal ideation. Hx anxiety / depression, substance abuse, ETOH use. P:  Sedation: Propofol gtt / fentanyl PRN. RASS goal: 0 to -1. Daily WUA. Suicide precautions. Hold outpatient clonazepam, lexapro, trazodone. Thiamine / folate. EEG per neuro  PULMONARY OETT 10/17 >>> A: VDRF due to inability to protect airway in the setting of suspected overdose. P:  Full mechanical support, wean as  able. VAP prevention measures. CXR in AM.  CARDIOVASCULAR A:  No acute issues. Elevated troponin P:  Check echo Monitor hemodynamics.  RENAL A:  Hypokalemia. Mildly elevated CK - not in range for rhabdo. P:  Replete K NS @ 100. Trend BMP  GASTROINTESTINAL A:  GI prophylaxis. Nutrition. P:  SUP: Pantoprazole. NPO.  HEMATOLOGIC A:  VTE Prophylaxis. P:  SCD's / Heparin. Monitor CBC  INFECTIOUS A:  No indication for infection. P:  Monitor clinically.  ENDOCRINE A:  No acute issues.  P:  SSI if glucose consistently > 180.  Family updated: None available.  Interdisciplinary Family Meeting v Palliative Care Meeting: Due by: 10/23.  Katina Degree. Jimmey Ralph, MD [facility] Family Medicine Resident PGY-2 06/09/2015 7:52 AM

## 2015-06-09 NOTE — Progress Notes (Signed)
Called Elink to informed critical lab value potassium 2.7. At this time pt hadn't received potassium 80meq as ordered. Awaiting KUB results for tube placement. Pt was given potassium 80meq as ordered once placement was confirmed. Ordered placed by Dr. Jamison NeighborNestor repeat BMP 1100.

## 2015-06-09 NOTE — Progress Notes (Signed)
eLink Physician-Brief Progress Note Patient Name: Johnny HarmanCraig E Fearn DOB: 1996/07/12 MRN: [id number]   Date of Service  06/09/2015  HPI/Events of Note  Request for bilateral wrist restraints.   eICU Interventions  Will order bilateral wrist restraints.      Intervention Category Minor Interventions: Agitation / anxiety - evaluation and management  [name] 06/09/2015, 8:03 PM

## 2015-06-09 NOTE — ED Notes (Signed)
The pt has minimal movement of his  Arms and legs  Pupils at 2.0 sl reactive equal .  porp cut off for approz 5 minutes while dr Thad Rangerreynolds examined him  No significant difference in his reactiuon

## 2015-06-09 NOTE — Progress Notes (Signed)
RN & RT transported patient from ED to without any complications.

## 2015-06-09 NOTE — Progress Notes (Signed)
Dr Marchelle Gearingamaswamy informed of patient's low urine output. Orders received

## 2015-06-09 NOTE — ED Notes (Signed)
Report called to rn on 2100

## 2015-06-09 NOTE — Clinical Social Work Note (Signed)
CSW consult acknowledged:  Clinical Child psychotherapistocial Worker received a consult in reference to substance abuse and unit staff not being able to locate patient's mother listed on chart. CSW has reviewed patient's chart and noted that patient presented to ED with suicidal ideation on 10/9 and discharged on 10/10 stable with follow up for outpatient psychiatry.   Patient also has a h/o depression, anxiety, and ETOH. Patient positive for amphetamines and benzo's. Questionable overdose/suicide attempt.   Patient will need psych consult once extubated/appropriate for psychiatric assessment. Patient currently intubated with vent at 30%.  CSW has attempted to contact patient's mother, Merlene LaughterCelia via telephone [phone] and left a voice message for a returned phone call (number listed in chart has incorrect area code). CSW to update. CSW has also contacted non-emergency police to visit pt's address listed in chart to locate pt's mother and or available family members.   CSW will continue to follow pt to locate family members.  Derenda FennelBashira Johann Gascoigne, MSW, LCSWA [phone] 06/09/2015 9:55 AM

## 2015-06-09 NOTE — ED Notes (Signed)
RT called to room due to air leak after attempting to insert OG. Patuient wasn't getting return volumes on ventilator and after inserting mor air into balloon, no improvement.  RT notified ER MD and patient was reintubated with 7.5 ETT tube with Hyacinth MeekerMiller 3 with no complication.

## 2015-06-09 NOTE — H&P (Signed)
PULMONARY / CRITICAL CARE MEDICINE   Name: Johnny Fuller MRN: [id number] DOB: [birth year]    ADMISSION DATE:  06/08/2015 CONSULTATION DATE:  06/09/15  REFERRING MD :  EDP  CHIEF COMPLAINT:  Unresponsive  INITIAL PRESENTATION:  19 y.o. M dropped off outside of hospital night of 06/08/15.  In ED, had GCS of 3 therefore intubated for airway protection.  PCCM called for admission of suspected overdose.  Of note, [facility] for [facility] ideation.   STUDIES:  CXR 10/17 >>> neg CT head 10/17 >>> neg  SIGNIFICANT EVENTS: 10/17 - admitted   HISTORY OF PRESENT ILLNESS:  Pt is encephalopathic; therefore, this HPI is obtained from chart review. Johnny Fuller is a 19 y.o. M with PMH of Anemia, Depression, Anxiety, Genital Herpes.  He was reportedly dropped [facility] outside [facility] on night of 06/08/15.  Hospital security was called and informed that pt was dropped off and was unresponsive.  Pt was brought to ED where he remained unresponsive with GCS of 3.  He was given a round of narcan (? Dose) which reportedly had no effect.  He was subsequently intubated for airway protection.  UDS positive for amphetamines and benzo's.  CT of the head negative for acute process. K low at 2.8.  Otherwise, rest of labs unremarkable.  Of note, [facility] for suicidal ideation.  At that time, he had used crystal meth, trazodone, and alcohol the day prior and informed EDP that he had suicidal ideation (with no actual attempt) and was thinking about hanging himself.  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  He was deemed stable and discharged on 06/02/15 with plans for outpatient follow up with psychiatry.   PAST MEDICAL HISTORY :  Anemia, Depression, Anxiety, Genital Herpes   has no past medical history on file.  has no past surgical history on file.   PTA Meds: Clonazepam 1mg   daily PRN, Lexapro 10mg  daily, Trazodone 50mg  QHS PRN, Nicotine patch. Prior to Admission medications   Not on File   Allergies not on file  FAMILY HISTORY:  No family history on file.  SOCIAL HISTORY:  has no tobacco, alcohol, and drug history on file.  REVIEW OF SYSTEMS:  Unable to obtain as pt is encephalopathic.  SUBJECTIVE:   VITAL SIGNS: Temp:  [98.1 F (36.7 C)] 98.1 F (36.7 C) (10/16 2240) Pulse Rate:  [102-137] 102 (10/17 0000) Resp:  [14-26] 15 (10/17 0000) BP: (94-166)/(61-123) 133/93 mmHg (10/17 0000) SpO2:  [99 %-100 %] 100 % (10/17 0000) FiO2 (%):  [60 %-100 %] 60 % (10/16 2305) Weight:  [63.957 kg (141 lb)] 63.957 kg (141 lb) (10/16 2235) HEMODYNAMICS:   VENTILATOR SETTINGS: Vent Mode:  [-] PRVC FiO2 (%):  [60 %-100 %] 60 % Set Rate:  [14 bmp] 14 bmp Vt Set:  [500 mL-530 mL] 530 mL PEEP:  [5 cmH20] 5 cmH20 Plateau Pressure:  [15 cmH20] 15 cmH20 INTAKE / OUTPUT: Intake/Output    None     PHYSICAL EXAMINATION: General: Young male, in NAD. Neuro: Non-responsive; however, is sedated.  GCS 3. HEENT: [place]/AT. Pupils non-responsive to light. Cardiovascular: RRR, no M/R/G.  Lungs: Respirations even and unlabored.  CTA bilaterally, No W/R/R.  Abdomen: BS x 4, soft, NT/ND.  Musculoskeletal: No gross deformities, no edema.  Skin: Intact, warm, no rashes.  LABS:  CBC  Recent Labs Lab 06/08/15  2231  WBC 6.4  HGB 15.6  HCT 43.2  PLT 253   Coag's No results for input(s): APTT, INR in the last 168 hours. BMET  Recent Labs Lab 06/08/15 2231  NA 135  K 2.8*  CL 96*  CO2 27  BUN 7  CREATININE 1.00  GLUCOSE 108*   Electrolytes  Recent Labs Lab 06/08/15 2231  CALCIUM 9.4   Sepsis Markers  Recent Labs Lab 06/08/15 2258  LATICACIDVEN 1.96   ABG  Recent Labs Lab 06/08/15 2344  PHART 7.491*  PCO2ART 39.1  PO2ART 271.0*   Liver Enzymes  Recent Labs Lab 06/08/15 2231  AST 64*  ALT 49  ALKPHOS 66  BILITOT 1.0  ALBUMIN 4.9    Cardiac Enzymes No results for input(s): TROPONINI, PROBNP in the last 168 hours. Glucose No results for input(s): GLUCAP in the last 168 hours.  Imaging Ct Head Wo Contrast  06/09/2015  CLINICAL DATA:  Dropped off at hospital, unresponsive. Initial encounter. EXAM: CT HEAD WITHOUT CONTRAST TECHNIQUE: Contiguous axial images were obtained from the base of the skull through the vertex without intravenous contrast. COMPARISON:  None. FINDINGS: There is no evidence of acute infarction, mass lesion, or intra- or extra-axial hemorrhage on CT. The posterior fossa, including the cerebellum, brainstem and fourth ventricle, is within normal limits. The third and lateral ventricles, and basal ganglia are unremarkable in appearance. The cerebral hemispheres are symmetric in appearance, with normal gray-white differentiation. No mass effect or midline shift is seen. There is no evidence of fracture; visualized osseous structures are unremarkable in appearance. The visualized portions of the orbits are within normal limits. The paranasal sinuses and mastoid air cells are well-aerated. No significant soft tissue abnormalities are seen. IMPRESSION: Unremarkable noncontrast CT of the head. Electronically Signed   By: Roanna Raider M.D.   On: 06/09/2015 00:23   Dg Chest Portable 1 View  06/09/2015  CLINICAL DATA:  Endotracheal tube placement.  Found unresponsive. EXAM: PORTABLE CHEST 1 VIEW COMPARISON:  None. FINDINGS: The endotracheal tube is 3.3 cm from the carina. Lung volumes are low. The cardiomediastinal contours are normal. Pulmonary vasculature is normal. No consolidation, pleural effusion, or pneumothorax. No acute osseous abnormalities are seen. IMPRESSION: 1. Endotracheal tube 3.3 cm from the carina. 2. Hypo aerated but clear lungs. Electronically Signed   By: Rubye Oaks M.D.   On: 06/09/2015 00:01     ASSESSMENT / PLAN:  NEUROLOGIC A:   Acute encephalopathy - likely due to overdose; UDS  positive for amphetamines and benzo's. Recent ED presentation and [facility] admission due to suicidal ideation. Hx anxiety / depression, substance abuse, ETOH use. P:   Sedation:  Propofol gtt / fentanyl PRN. RASS goal: 0 to -1. Daily WUA. Neuro consulted by EDP. Suicide precautions. Rounding team to please consult psychiatry in AM. Hold outpatient clonazepam, lexapro, trazodone. Thiamine / folate.  PULMONARY OETT 10/17 >>> A: VDRF due to inability to protect airway in the setting of suspected overdose (? As part of suicide attempt). P:   Full mechanical support, wean as able. VAP prevention measures. SBT in AM if able. CXR in AM.  CARDIOVASCULAR A:  No acute issues. P:  Check troponin. Monitor hemodynamics.  RENAL A:   Hypokalemia. Mildly elevated CK - not in range for rhabdo. P:   Potassium per tube. NS @ 100. Repeat CK in AM. Follow up on serum osmoles to evaluate for possible co-ingestion. BMP in AM.  GASTROINTESTINAL A:   GI prophylaxis. Nutrition. P:  SUP: Pantoprazole. NPO.  HEMATOLOGIC A:   VTE Prophylaxis. P:  SCD's / Heparin. CBC in AM.  INFECTIOUS A:   No indication for infection. P:   Monitor clinically.  ENDOCRINE A:   No acute issues.   P:   SSI if glucose consistently > 180.   Family updated: None available.  Interdisciplinary Family Meeting v Palliative Care Meeting:  Due by: 10/23.  CC time:  30 minutes.   Rutherford Guys, [place] [facility] Pager: [phone]  or [phone] 06/09/2015, 12:53 AM

## 2015-06-09 NOTE — Clinical Social Work Note (Signed)
Clinical Social Worker notified that non-emergency police able to locate patient's mother, Johnny Fuller at [address] [place], [place] [id number] (pt's mother address).   Pt's mother contacted CSW. CSW informed pt's mother of patient being located in ICU at [facility]. CSW advised pt's mother to contact Unit Staff and request to speak to pt's bedside RN. Pt's mother expressed understanding.   Pt's mother contact information: Home: [phone] Cell: [phone]  CSW remains available as needed.   Derenda FennelBashira Linas Stepter, MSW, LCSWA [phone] 06/09/2015 10:46 AM

## 2015-06-09 NOTE — Procedures (Addendum)
EEG report.  Brief clinical history:18 y.o. male who is currently unresponsive and intubated. No family or friends present. All history obtained from the chart. [facility] by an unknown person. Patient was unresponsive. Was placed in a wheelchair and brought to the ED. In ED was intubated and now on Propofol. Some involuntary movements noted  Technique: this is a 17 channel routine scalp EEG performed at the bedside in the ICU, with bipolar and monopolar montages arranged in accordance to the international 10/20 system of electrode placement. One channel was dedicated to EKG recording.  The patient is sedated, intubated on the vent. No activating procedures performed.  Description: as the study opens and throughout the entire recording, there is diffusely continuous alpha activity that is for the most part unreactive and more dominant over the anterior head regions, at times with superimposed beta frequencies anteriorly. . No electrographic seizures, focal or generalized epileptiform discharges noted.   EKG showed sinus rhythm.  Impression: this is an abnormal EEG with findings resembling an alpha coma pattern which can be seen in anoxic brain injury but also as result of medications/sedatives.  A follow up study with minimal or no sedation would be prudent. Clinical correlation is advised.   Wyatt Portelasvaldo Camilo, MD [facility]

## 2015-06-09 NOTE — ED Notes (Signed)
pts clothes have been cut off he has a cap a telephone  A watch   Wallet with credit cards necklace gold,colored placed in bio bags

## 2015-06-09 NOTE — Progress Notes (Signed)
Initial Nutrition Assessment  DOCUMENTATION CODES:   Not applicable  INTERVENTION:    Initiate TF via OGT with Vital AF 1.2 at 25 ml/h and Prostat 30 ml BID on day 1; on day 2, increase to goal rate of 55 ml/h (1320 ml per day) to provide 1584 kcals, 99 gm protein, 1071 ml free water daily.  TF plus kcals from propofol will provide a total of 1737 kcals per day (99% of estimated needs).  NUTRITION DIAGNOSIS:   Inadequate oral intake related to inability to eat as evidenced by NPO status.  GOAL:   Patient will meet greater than or equal to 90% of their needs  MONITOR:   Vent status, TF tolerance, Weight trends, Labs, I & O's  REASON FOR ASSESSMENT:   Ventilator, Rounds    ASSESSMENT:   19 y.o. M dropped off outside of hospital night of 06/08/15. In ED, had GCS of 3 therefore intubated for airway protection. PCCM called for admission of suspected overdose. Of note, [facility] for suicidal ideation.  Discussed patient in ICU rounds and with RN today. Unsure when will be able to extubate patient, RD to order TF. Nutrition focused physical exam completed.  No muscle or subcutaneous fat depletion noticed.  Patient is currently intubated on ventilator support MV: 7.8 L/min Temp (24hrs), Avg:98.3 F (36.8 C), Min:98.1 F (36.7 C), Max:98.4 F (36.9 C)  Propofol: 5.8 ml/hr providing 153 kcals per day.   Diet Order:  Diet NPO time specified  Skin:  Reviewed, no issues  Last BM:  unknown  Height:   Ht Readings from Last 1 Encounters:  06/09/15 5\' 10"  (1.778 m) (56 %*, Z = 0.14)   * Growth percentiles are based on CDC 2-20 Years data.    Weight:   Wt Readings from Last 1 Encounters:  06/09/15 131 lb 13.4 oz (59.8 kg) (14 %*, Z = -1.09)   * Growth percentiles are based on CDC 2-20 Years data.    Ideal Body Weight:  75.5 kg  BMI:  Body mass index is 18.92 kg/(m^2).  Estimated Nutritional Needs:   Kcal:  1750  Protein:  90-100  gm  Fluid:  1.8-2 L  EDUCATION NEEDS:   No education needs identified at this time  Joaquin CourtsKimberly Lonny Eisen, RD, LDN, CNSC Pager [phone] After Hours Pager [phone]

## 2015-06-10 ENCOUNTER — Inpatient Hospital Stay (HOSPITAL_COMMUNITY): Payer: Medicaid Other

## 2015-06-10 DIAGNOSIS — G9340 Encephalopathy, unspecified: Secondary | ICD-10-CM

## 2015-06-10 DIAGNOSIS — R0689 Other abnormalities of breathing: Secondary | ICD-10-CM

## 2015-06-10 LAB — BASIC METABOLIC PANEL
Anion gap: 7 (ref 5–15)
BUN: 6 mg/dL (ref 6–20)
CHLORIDE: 109 mmol/L (ref 101–111)
CO2: 23 mmol/L (ref 22–32)
CREATININE: 0.69 mg/dL (ref 0.61–1.24)
Calcium: 8.3 mg/dL — ABNORMAL LOW (ref 8.9–10.3)
GFR calc Af Amer: >60 mL/min (ref >60)
GFR calc non Af Amer: >60 mL/min (ref >60)
Glucose, Bld: 115 mg/dL — ABNORMAL HIGH (ref 65–99)
Potassium: 3.3 mmol/L — ABNORMAL LOW (ref 3.5–5.1)
Sodium: 139 mmol/L (ref 135–145)

## 2015-06-10 LAB — GLUCOSE, CAPILLARY
GLUCOSE-CAPILLARY: 93 mg/dL (ref 65–99)
GLUCOSE-CAPILLARY: 99 mg/dL (ref 65–99)
GLUCOSE-CAPILLARY: 93 mg/dL (ref 65–99)
Glucose-Capillary: 96 mg/dL (ref 65–99)

## 2015-06-10 LAB — CK: Total CK: 204 U/L (ref 49–397)

## 2015-06-10 LAB — CBC
HEMATOCRIT: 34.2 % — AB (ref 39.0–52.0)
HEMOGLOBIN: 12 g/dL — AB (ref 13.0–17.0)
MCH: 31.3 pg (ref 26.0–34.0)
MCHC: 35.1 g/dL (ref 30.0–36.0)
MCV: 89.1 fL (ref 78.0–100.0)
Platelets: 189 10*3/uL (ref 150–400)
RBC: 3.84 MIL/uL — ABNORMAL LOW (ref 4.22–5.81)
RDW: 12.4 % (ref 11.5–15.5)
WBC: 8.1 10*3/uL (ref 4.0–10.5)

## 2015-06-10 LAB — LACTIC ACID, PLASMA
LACTIC ACID, VENOUS: 1.1 mmol/L (ref 0.5–2.0)
LACTIC ACID, VENOUS: 1.8 mmol/L (ref 0.5–2.0)

## 2015-06-10 LAB — MAGNESIUM: MAGNESIUM: 1.9 mg/dL (ref 1.7–2.4)

## 2015-06-10 LAB — PROCALCITONIN: Procalcitonin: <0.1 ng/mL

## 2015-06-10 MED ORDER — POTASSIUM CHLORIDE 20 MEQ/15ML (10%) PO SOLN
40.0000 meq | Freq: Once | ORAL | Status: AC
  Administered 2015-06-10: 40 meq
  Filled 2015-06-10: qty 30

## 2015-06-10 MED ORDER — MIDAZOLAM HCL 2 MG/2ML IJ SOLN
2.0000 mg | INTRAMUSCULAR | Status: DC | PRN
Start: 2015-06-10 — End: 2015-06-14
  Administered 2015-06-10 – 2015-06-11 (×2): 2 mg via INTRAVENOUS
  Filled 2015-06-10 (×3): qty 2

## 2015-06-10 MED ORDER — VANCOMYCIN HCL 10 G IV SOLR
1250.0000 mg | Freq: Once | INTRAVENOUS | Status: AC
  Administered 2015-06-10: 1250 mg via INTRAVENOUS
  Filled 2015-06-10: qty 1250

## 2015-06-10 MED ORDER — VANCOMYCIN HCL IN DEXTROSE 1-5 GM/200ML-% IV SOLN
1000.0000 mg | Freq: Three times a day (TID) | INTRAVENOUS | Status: DC
  Administered 2015-06-11 – 2015-06-13 (×7): 1000 mg via INTRAVENOUS
  Filled 2015-06-10 (×13): qty 200

## 2015-06-10 MED ORDER — DEXMEDETOMIDINE HCL IN NACL 400 MCG/100ML IV SOLN
0.4000 ug/kg/h | INTRAVENOUS | Status: DC
  Administered 2015-06-10: 0.4 ug/kg/h via INTRAVENOUS
  Administered 2015-06-10 – 2015-06-11 (×5): 1.2 ug/kg/h via INTRAVENOUS
  Administered 2015-06-12: 0.8 ug/kg/h via INTRAVENOUS
  Filled 2015-06-10 (×8): qty 100

## 2015-06-10 MED ORDER — ACETAMINOPHEN 160 MG/5ML PO SOLN
650.0000 mg | Freq: Four times a day (QID) | ORAL | Status: DC | PRN
  Administered 2015-06-10 – 2015-06-11 (×3): 650 mg
  Filled 2015-06-10 (×3): qty 20.3

## 2015-06-10 MED ORDER — PIPERACILLIN-TAZOBACTAM 3.375 G IVPB
3.3750 g | Freq: Three times a day (TID) | INTRAVENOUS | Status: DC
  Administered 2015-06-10 – 2015-06-12 (×6): 3.375 g via INTRAVENOUS
  Filled 2015-06-10 (×8): qty 50

## 2015-06-10 NOTE — Progress Notes (Signed)
Attempting to wean Propofol and transition to Precidex only as per MD discussion this morning, whenever I wean propofol below 45 patient becomes combative and hits siderails on bed.

## 2015-06-10 NOTE — Progress Notes (Signed)
Tylenol administered for fever as per orders.

## 2015-06-10 NOTE — Progress Notes (Signed)
eLink Physician-Brief Progress Note Patient Name: Annabelle HarmanCraig E Ben DOB: 1996-02-08 MRN: [id number]   Date of Service  06/10/2015  HPI/Events of Note  Hypokalemia 3.3.  eICU Interventions  Add on Mag to previous labs. KCl via tube 40 mEq.     Intervention Category Major Interventions: Electrolyte abnormality - evaluation and management  Lawanda CousinsJennings Kartik Fernando 06/10/2015, 3:32 AM

## 2015-06-10 NOTE — Progress Notes (Signed)
eLink Physician-Brief Progress Note Patient Name: Annabelle HarmanCraig E Servello DOB: Aug 18, 1996 MRN: [id number]   Date of Service  06/10/2015  HPI/Events of Note  Fever = 100.6 F. Patient already pan cultured and started on empiric Vancomycin and Zosyn.   eICU Interventions  Will order: 1. Tylenol liquid 650 mg PO Q 6 hours PRN Temp > 101.5 F.     Intervention Category Major Interventions: Other:  Jacksen Isip Dennard Nipugene 06/10/2015, 5:07 PM

## 2015-06-10 NOTE — Progress Notes (Signed)
Subjective: Patient remains intubated. He reportedly was following verbal commands prior to being sedated just before I arrived.  Objective: Current vital signs: BP 122/70 mmHg  Pulse 92  Temp(Src) 98.6 F (37 C) (Oral)  Resp 19  Ht 5\' 10"  (1.778 m)  Wt 64.3 kg (141 lb 12.1 oz)  BMI 20.34 kg/m2  SpO2 100%  Neurologic Exam: Intubated with spontaneous respirations. Patient has required physical restraints, in addition to sedation with propofol. He was arousable partially but unable to follow commands. Pupils were equal and reacted normally to light. Extraocular movements were full with right left lateral gaze, as well as conjugate. No facial asymmetry was noted. Patient moved extremities equally with normal strength throughout. Deep tendon reflexes were normal and symmetrical. Plantar responses were mute.  EEG on 06/09/2015 showed nonspecific encephalopathic changes with no evidence of epileptiform activity. Repeat study recommended when patient is more awake.  Medications: I have reviewed the patient's current medications.  Assessment/Plan: 19 year old admitted with encephalopathy thought to be related to drug use and possible unintentional overdose. Mental status has improved. Patient is more responsive and has no focal deficits at this point. Anticipate extubation later today.  Recommend no changes in current management. Obtain MRI of the brain without contrast when feasible. We will continue to follow this patient with you.  C.R. Roseanne RenoStewart, MD [facility] [phone]  06/10/2015  8:24 AM

## 2015-06-10 NOTE — Progress Notes (Signed)
ANTIBIOTIC CONSULT NOTE - INITIAL  Pharmacy Consult for Vancomycin, Zosyn  Indication: Sepsis   Allergies  Allergen Reactions  . Sulfa Antibiotics Other (See Comments)    Unknown, childhood reaction    Patient Measurements: Height: 5\' 10"  (177.8 cm) Weight: 141 lb 12.1 oz (64.3 kg) IBW/kg (Calculated) : 73  Vital Signs: Temp: 100.2 F (37.9 C) (10/18 1400) Temp Source: Oral (10/18 0746) BP: 138/85 mmHg (10/18 1400) Pulse Rate: 89 (10/18 1400) Intake/Output from previous day: 10/17 0701 - 10/18 0700 In: 4159.7 [I.V.:2585.9; NG/GT:573.8; IV Piggyback:1000] Out: 907 [Urine:807; Emesis/NG output:100] Intake/Output from this shift: Total I/O In: 1023.7 [I.V.:723.7; NG/GT:300] Out: 200 [Urine:200]  Labs:  Recent Labs  06/08/15 2231 06/09/15 0236 06/09/15 1100 06/10/15 0215  WBC 6.4 6.4  --  8.1  HGB 15.6 13.1  --  12.0*  PLT 253 242  --  189  CREATININE 1.00 0.81 0.82 0.69   Estimated Creatinine Clearance: 135.1 mL/min (by C-G formula based on Cr of 0.69). No results for input(s): VANCOTROUGH, VANCOPEAK, VANCORANDOM, GENTTROUGH, GENTPEAK, GENTRANDOM, TOBRATROUGH, TOBRAPEAK, TOBRARND, AMIKACINPEAK, AMIKACINTROU, AMIKACIN in the last 72 hours.   Microbiology: Recent Results (from the past 720 hour(s))  MRSA PCR Screening     Status: None   Collection Time: 06/09/15  2:07 AM  Result Value Ref Range Status   MRSA by PCR NEGATIVE NEGATIVE Final    Comment:        The GeneXpert MRSA Assay (FDA approved for NASAL specimens only), is one component of a comprehensive MRSA colonization surveillance program. It is not intended to diagnose MRSA infection nor to guide or monitor treatment for MRSA infections.     Medical History: No past medical history on file.  Medications:  Prescriptions prior to admission  Medication Sig Dispense Refill Last Dose  . clonazePAM (KLONOPIN) 1 MG tablet Take 1 mg by mouth daily as needed for anxiety.   unknown  . nicotine  (NICODERM CQ - DOSED IN MG/24 HOURS) 21 mg/24hr patch Place 21 mg onto the skin daily.   unknown  . traZODone (DESYREL) 50 MG tablet Take 50 mg by mouth at bedtime.   unknown   Assessment: [age] YOM admitted after drug overdose. Currently intubated with acute encephalopathy but mental status has improved. WBC is wnl, LA 1.96. Pharmacy consulted to start empiric antibiotics since patient is spiking fevers with unknown cause. CrCl > 100 mL/min   10/18 BCx2>> 10/18 UCx>>   Goal of Therapy:  Vancomycin trough level 15-20 mcg/ml  Plan:  -Start Zosyn 3.375 gm IV Q 8 hours -Give Vancomycin 1250 mg IV once followed by 1 gm IV Q 8 hours -Monitor CBC, renal fx, cultures and clinical progress -[facility]   Vinnie LevelBenjamin Endia Moncur, PharmD., BCPS Clinical Pharmacist Pager [phone]

## 2015-06-10 NOTE — Progress Notes (Signed)
  Echocardiogram 2D Echocardiogram has been performed.  Leta JunglingCooper, Liba Hulsey M 06/10/2015, 10:48 AM

## 2015-06-10 NOTE — Progress Notes (Addendum)
PULMONARY / CRITICAL CARE MEDICINE   Name: Johnny Fuller MRN: [id number] DOB: 1996-02-26    ADMISSION DATE:  06/08/2015 CONSULTATION DATE:  06/09/2015  REFERRING MD :  EDP  CHIEF COMPLAINT:  Unresponsive  INITIAL PRESENTATION: 19 y.o. M dropped off outside of hospital night of 06/08/15. In ED, had GCS of 3 therefore intubated for airway protection. PCCM called for admission of suspected overdose. Of note, he was seen in [facility] ED 06/01/15 for suicidal ideation.  STUDIES:  CXR 10/17 >>> neg CT head 10/17 >>> neg UDS: Amphetamines and benzos  SIGNIFICANT EVENTS: 10/17 - Admit  SUBJECTIVE:  Intubated, sedated. Arouses to physical stimuli.   VITAL SIGNS: Temp:  [98.2 F (36.8 C)-99.4 F (37.4 C)] 98.8 F (37.1 C) (10/18 0746) Pulse Rate:  [77-108] 92 (10/18 0700) Resp:  [14-44] 17 (10/18 0700) BP: (113-130)/(45-85) 122/70 mmHg (10/18 0700) SpO2:  [99 %-100 %] 100 % (10/18 0700) FiO2 (%):  [30 %] 30 % (10/18 0407) Weight:  [141 lb 12.1 oz (64.3 kg)] 141 lb 12.1 oz (64.3 kg) (10/18 0400) HEMODYNAMICS:   VENTILATOR SETTINGS: Vent Mode:  [-] PRVC FiO2 (%):  [30 %] 30 % Set Rate:  [14 bmp] 14 bmp Vt Set:  [530 mL] 530 mL PEEP:  [5 cmH20] 5 cmH20 Plateau Pressure:  [16 cmH20] 16 cmH20 INTAKE / OUTPUT:  Intake/Output Summary (Last 24 hours) at 06/10/15 0748 Last data filed at 06/10/15 0700  Gross per 24 hour  Intake 4159.67 ml  Output    907 ml  Net 3252.67 ml    PHYSICAL EXAMINATION: General:  18yo male, intubated and sedated Neuro:  Arouses to physical stimuli, does not follow commands HEENT:  NCAT Cardiovascular:  RRR, s1s2 normal Lungs:  Clear bilaterally Abdomen:  +BS, S, NT, ND Musculoskeletal:  No cyanosis or clubbing Skin:  Intact, dry  LABS:  CBC  Recent Labs Lab 06/08/15 2231 06/09/15 0236 06/10/15 0215  WBC 6.4 6.4 8.1  HGB 15.6 13.1 12.0*  HCT 43.2 35.9* 34.2*  PLT 253 242 189   Coag's No results for input(s): APTT, INR in the last 168  hours. BMET  Recent Labs Lab 06/09/15 0236 06/09/15 1100 06/10/15 0215  NA 138 141 139  K 2.7* 3.6 3.3*  CL 100* 110 109  CO2 BUN 5* <5* 6  CREATININE 0.81 0.82 0.69  GLUCOSE 91 97 115*   Electrolytes  Recent Labs Lab 06/09/15 0236 06/09/15 1100 06/10/15 0215  CALCIUM 8.7* 8.8* 8.3*  MG 2.0  --  1.9  PHOS 4.0  --   --    Sepsis Markers  Recent Labs Lab 06/08/15 2258  LATICACIDVEN 1.96   ABG  Recent Labs Lab 06/08/15 2344 06/09/15 0424  PHART 7.491* 7.513*  PCO2ART 39.1 31.3*  PO2ART 271.0* 208*   Liver Enzymes  Recent Labs Lab 06/08/15 2231  AST 64*  ALT 49  ALKPHOS 66  BILITOT 1.0  ALBUMIN 4.9   Cardiac Enzymes  Recent Labs Lab 06/09/15 0840 06/09/15 1447 06/09/15 2030  TROPONINI 0.56* 0.34* 0.20*   Glucose  Recent Labs Lab 06/08/15 2223 06/09/15 2030  GLUCAP 105* 96    Imaging Dg Chest Port 1 View  06/10/2015  CLINICAL DATA:  Respiratory failure. EXAM: PORTABLE CHEST 1 VIEW COMPARISON:  06/08/2015. FINDINGS: Endotracheal tube and NG tube in stable position. Heart size stable. Mild bibasilar subsegmental atelectasis. No pleural effusion or pneumothorax. IMPRESSION: 1. Lines and tubes in stable position. 2. Mild bibasilar subsegmental atelectasis. Electronically  Signed   By: Maisie Fushomas  Register   On: 06/10/2015 07:17     ASSESSMENT / PLAN:   NEUROLOGIC A:  Acute encephalopathy - likely due to overdose; UDS positive for amphetamines and benzo's. [facility] due [facility] ideation. Hx anxiety / depression, substance abuse, ETOH use. P:  Sedation: Propofol gtt / fentanyl PRN. RASS goal: 0 to -1. Daily WUA. Suicide precautions. Hold outpatient clonazepam, lexapro, trazodone. Thiamine / folate. EEG with alpha coma pattern consistent with anoxic brain injury or sedation  PULMONARY OETT 10/17 >>> A: VDRF due to inability to protect airway in the setting of suspected overdose. P:   Full mechanical support, wean as able. VAP prevention measures. CXR in AM.  CARDIOVASCULAR A:  No acute issues. Elevated troponin - trending down P:  Check echo Monitor hemodynamics.  RENAL A:  Hypokalemia. Mildly elevated CK - not in range for rhabdo. P:  Replete K NS @ 100. Trend BMP  GASTROINTESTINAL A:  GI prophylaxis. Nutrition. P:  SUP: Pantoprazole. Tube Feeds  HEMATOLOGIC A:  VTE Prophylaxis. P:  SCD's / Heparin. Monitor CBC  INFECTIOUS A:  No indication for infection. P:  Monitor clinically.  ENDOCRINE A:  No acute issues.  P:  SSI if glucose consistently > 180.  Family updated: None available.  Interdisciplinary Family Meeting v Palliative Care Meeting: Due by: 10/23.  Katina Degreealeb M. Jimmey RalphParker, MD [facility] Resident PGY-2 06/10/2015 7:48 AM

## 2015-06-11 ENCOUNTER — Encounter (HOSPITAL_COMMUNITY): Payer: Self-pay | Admitting: Physician Assistant

## 2015-06-11 ENCOUNTER — Inpatient Hospital Stay (HOSPITAL_COMMUNITY): Payer: Medicaid Other

## 2015-06-11 DIAGNOSIS — J9601 Acute respiratory failure with hypoxia: Secondary | ICD-10-CM

## 2015-06-11 DIAGNOSIS — T50901A Poisoning by unspecified drugs, medicaments and biological substances, accidental (unintentional), initial encounter: Secondary | ICD-10-CM

## 2015-06-11 DIAGNOSIS — T1491 Suicide attempt: Secondary | ICD-10-CM

## 2015-06-11 DIAGNOSIS — G9340 Encephalopathy, unspecified: Secondary | ICD-10-CM | POA: Diagnosis present

## 2015-06-11 DIAGNOSIS — I519 Heart disease, unspecified: Secondary | ICD-10-CM

## 2015-06-11 LAB — CBC
HEMATOCRIT: 36.4 % — AB (ref 39.0–52.0)
HEMOGLOBIN: 12.8 g/dL — AB (ref 13.0–17.0)
MCH: 31.4 pg (ref 26.0–34.0)
MCHC: 35.2 g/dL (ref 30.0–36.0)
MCV: 89.2 fL (ref 78.0–100.0)
Platelets: 170 10*3/uL (ref 150–400)
RBC: 4.08 MIL/uL — ABNORMAL LOW (ref 4.22–5.81)
RDW: 12.1 % (ref 11.5–15.5)
WBC: 15.9 10*3/uL — ABNORMAL HIGH (ref 4.0–10.5)

## 2015-06-11 LAB — BASIC METABOLIC PANEL
ANION GAP: 9 (ref 5–15)
BUN: 7 mg/dL (ref 6–20)
CHLORIDE: 104 mmol/L (ref 101–111)
CO2: 25 mmol/L (ref 22–32)
CREATININE: 0.84 mg/dL (ref 0.61–1.24)
Calcium: 8.2 mg/dL — ABNORMAL LOW (ref 8.9–10.3)
GFR calc non Af Amer: >60 mL/min (ref >60)
GLUCOSE: 117 mg/dL — AB (ref 65–99)
Potassium: 3.6 mmol/L (ref 3.5–5.1)
Sodium: 138 mmol/L (ref 135–145)

## 2015-06-11 LAB — URINE CULTURE: Culture: NO GROWTH

## 2015-06-11 LAB — MAGNESIUM: Magnesium: 1.8 mg/dL (ref 1.7–2.4)

## 2015-06-11 LAB — GLUCOSE, CAPILLARY
GLUCOSE-CAPILLARY: 127 mg/dL — AB (ref 65–99)
GLUCOSE-CAPILLARY: 95 mg/dL (ref 65–99)
Glucose-Capillary: 113 mg/dL — ABNORMAL HIGH (ref 65–99)

## 2015-06-11 LAB — PHOSPHORUS: PHOSPHORUS: 2.9 mg/dL (ref 2.5–4.6)

## 2015-06-11 LAB — PROCALCITONIN: PROCALCITONIN: 2.31 ng/mL

## 2015-06-11 MED ORDER — POTASSIUM CHLORIDE 20 MEQ/15ML (10%) PO SOLN
20.0000 meq | ORAL | Status: DC
  Administered 2015-06-11: 20 meq
  Filled 2015-06-11 (×2): qty 15

## 2015-06-11 MED ORDER — POTASSIUM CHLORIDE 20 MEQ/15ML (10%) PO SOLN: 20.0000 meq | Freq: Once | ORAL | Status: DC

## 2015-06-11 MED ORDER — CARVEDILOL 3.125 MG PO TABS
3.1250 mg | ORAL_TABLET | Freq: Two times a day (BID) | ORAL | Status: DC
  Administered 2015-06-11 – 2015-06-13 (×4): 3.125 mg via ORAL
  Filled 2015-06-11 (×6): qty 1

## 2015-06-11 MED ORDER — MAGNESIUM SULFATE 2 GM/50ML IV SOLN
2.0000 g | Freq: Once | INTRAVENOUS | Status: AC
  Administered 2015-06-11: 2 g via INTRAVENOUS
  Filled 2015-06-11: qty 50

## 2015-06-11 MED ORDER — POTASSIUM CHLORIDE 20 MEQ/15ML (10%) PO SOLN
20.0000 meq | Freq: Once | ORAL | Status: AC
  Administered 2015-06-11: 20 meq

## 2015-06-11 NOTE — Progress Notes (Signed)
Patient refuses MRI at present .Johnny Doealeb Parker MD informed

## 2015-06-11 NOTE — Progress Notes (Signed)
Patient ID: Johnny Fuller, male   DOB: 16-Apr-1996, 19 y.o.   MRN: [id number]  Psych consultation received for intentional overdose and presented with acute encephalopathic state. Patient has been intubated on arrival and will be extubated when he can tolerate as per his physician. Patient has been intermittently agitated as of today. Please contact psych consultation after extubation and when he can participate in psych evaluation.   Johnny Fuller,Johnny Fuller. 06/11/2015 11:15 AM

## 2015-06-11 NOTE — Progress Notes (Addendum)
Pt taking gown off and saying he is refusing all treatment after patients mom Merlene LaughterCelia in to visit. precedex increased from 0.5 to 1.2 . I explained the the risks of not continuing his medical treatment. He continues to say he is going to leave. I asked him to talk to the Physician before leaving.Jacquiline Doealeb Parker MD informed and at bedside.

## 2015-06-11 NOTE — Procedures (Signed)
Extubation Procedure Note  Patient Details:   Name: Johnny HarmanCraig E Fuller DOB: 11-Mar-1996 MRN: [id number]   Airway Documentation: Patient had an audible cuff leak prior to extubation, able to follow all commands, Sat 99% on 4lpm [place] post extubation.  RR 22.  Incentive Spirometer given able to achieve 1000cc.  Good strong cough.  Will continue to monitor.    Evaluation  O2 sats: stable throughout Complications: No apparent complications Patient did tolerate procedure well. Bilateral Breath Sounds: Clear Suctioning: Airway Yes  Richmond CampbellHall, Haileyann Staiger Lynn 06/11/2015, 11:38 AM

## 2015-06-11 NOTE — Consult Note (Signed)
CARDIOLOGY CONSULT NOTE   Patient ID: Johnny Fuller MRN: [id number], DOB/AGE: 1995/09/10   Admit date: 06/08/2015 Date of Consult: 06/11/2015   Primary Physician: No primary care provider on file. Primary Cardiologist: new  Pt. Profile  The patient is a 19 year old Caucasian male was admitted for altered mental status secondary to likely intentional drug overdose requiring intubation. Noted to have LV dysfunction with EF 35-40% on echo. Now extubated and febrile, CXR shows RLL PNA.   Problem List  No past medical history on file.  Past Surgical History  Procedure Laterality Date  . No past surgeries       Allergies  Allergies  Allergen Reactions  . Sulfa Antibiotics Other (See Comments)    Unknown, childhood reaction    HPI   The patient is a 19 year old Caucasian male was admitted for altered mental status secondary to likely intentional drug overdose requiring intubation. Per note, patient was dropped off outside ED by unknown person on 06/08/2015. He was subsequently intubated for altered mental status. Initial laboratory finding include potassium 2.8, creatinine 1.0. Initial EKG was positive for sinus tachycardia without significant ST-T wave changes. CT of the head was negative for acute process. Urine drug test was positive for amphetamine and benzodiazepine. Blood alcohol was negative. Blood level for acetaminophen and salicylate was negative. Urinalysis was negative. Initial total CK was 859. Trop was elevated at 0.43 --> 0.56 --> 0.34 --> 0.20. He was seen by neurology who recommended obtaining an MRI was stable. EEG was performed on 10/17 with finding resembling alpha coma pattern which can be seen in anoxic brain injury but also has result of medication and sedatives. Initially, it was unclear whether this was unintentional overdose vs intentional suicidal attempt, later it was clarified by the family after checking with patient's friends that this was indeed  intentional. Psychiatry has been consulted for suicidal attempt. Echocardiogram was obtained on 06/10/2015 which showed EF 35-40%, mild TR/PR, normal RV size and function, PA peak pressure 41. On 10/18, patient had new onset of fever with Tmax 101-102. Overnight chest x-ray was consistent for right lower lobe pneumonia.  IV antibiotic has been started. Blood and urine cultures currently pending. Cardiology has been consulted for LV dysfunction.    Inpatient Medications  . antiseptic oral rinse  7 mL Mouth Rinse QID  . chlorhexidine gluconate  15 mL Mouth Rinse BID  . heparin  5,000 Units Subcutaneous 3 times per day  . magnesium sulfate 1 - 4 g bolus IVPB  2 g Intravenous Once  . pantoprazole (PROTONIX) IV  40 mg Intravenous QHS  . piperacillin-tazobactam (ZOSYN)  IV  3.375 g Intravenous Q8H  . thiamine  100 mg Intravenous Daily  . vancomycin  1,000 mg Intravenous Q8H    Family History Family History  Problem Relation Age of Onset  . CAD Neg Hx      Social History Social History   Social History  . Marital Status: Single    Spouse Name: N/A  . Number of Children: N/A  . Years of Education: N/A   Occupational History  . Not on file.   Social History Main Topics  . Smoking status: Current Some Day Smoker  . Smokeless tobacco: Not on file  . Alcohol Use: No  . Drug Use: Yes  . Sexual Activity: Not on file   Other Topics Concern  . Not on file   Social History Narrative  . No narrative on file     Review of  Systems  General:  No chills, fever, night sweats or weight changes.  Cardiovascular:  No chest pain, dyspnea on exertion, edema, orthopnea, palpitations, paroxysmal nocturnal dyspnea. Dermatological: No rash, lesions/masses Respiratory: No cough, dyspnea +throat sore after extubation Urologic: No hematuria, dysuria Abdominal:   No nausea, vomiting, diarrhea, bright red blood per rectum, melena, or hematemesis Neurologic:  +changes in mental status. All other  systems reviewed and are otherwise negative except as noted above.  Physical Exam  Blood pressure 113/61, pulse 88, temperature 101.2 F (38.4 C), temperature source Oral, resp. rate 30, height  (1.778 m), weight 144 lb 2.9 oz (65.4 kg), SpO2 100 %.  General: Pleasant, NAD Psych: Normal affect. Neuro: Alert and oriented X 3. Moves all extremities spontaneously. HEENT: Normal  Neck: Supple without bruits or JVD. Lungs:  Resp regular and unlabored, CTA. Heart: RRR no s3, s4, or murmurs. Abdomen: Soft, non-tender, non-distended, BS + x 4.  Extremities: No clubbing, cyanosis or edema. DP/PT/Radials 2+ and equal bilaterally.  Labs   Recent Labs  06/08/15 2336 06/09/15 0236 06/09/15 0840 06/09/15 1447 06/09/15 2030 06/10/15 0215  CKTOTAL 859* 771*  --   --   --  204  TROPONINI  --  0.43* 0.56* 0.34* 0.20*  --    Lab Results  Component Value Date   WBC 15.9* 06/11/2015   HGB 12.8* 06/11/2015   HCT 36.4* 06/11/2015   MCV 89.2 06/11/2015   PLT 170 06/11/2015    Recent Labs Lab 06/08/15 2231  06/11/15 0240  NA 135  < > 138  K 2.8*  < > 3.6  CL 96*  < > 104  CO2 27  < > 25  BUN 7  < > 7  CREATININE 1.00  < > 0.84  CALCIUM 9.4  < > 8.2*  PROT 7.5  --   --   BILITOT 1.0  --   --   ALKPHOS 66  --   --   ALT 49  --   --   AST 64*  --   --   GLUCOSE 108*  < > 117*  < > = values in this interval not displayed. Lab Results  Component Value Date   TRIG 62 06/09/2015   No results found for: DDIMER  Radiology/Studies  Ct Head Wo Contrast  06/09/2015  CLINICAL DATA:  Dropped off at hospital, unresponsive. Initial encounter. EXAM: CT HEAD WITHOUT CONTRAST TECHNIQUE: Contiguous axial images were obtained from the base of the skull through the vertex without intravenous contrast. COMPARISON:  None. FINDINGS: There is no evidence of acute infarction, mass lesion, or intra- or extra-axial hemorrhage on CT. The posterior fossa, including the cerebellum, brainstem and  fourth ventricle, is within normal limits. The third and lateral ventricles, and basal ganglia are unremarkable in appearance. The cerebral hemispheres are symmetric in appearance, with normal gray-white differentiation. No mass effect or midline shift is seen. There is no evidence of fracture; visualized osseous structures are unremarkable in appearance. The visualized portions of the orbits are within normal limits. The paranasal sinuses and mastoid air cells are well-aerated. No significant soft tissue abnormalities are seen. IMPRESSION: Unremarkable noncontrast CT of the head. Electronically Signed   By: Roanna Raider M.D.   On: 06/09/2015 00:23   Dg Chest Port 1 View  06/11/2015  CLINICAL DATA:  Acute respiratory failure.  Altered mental status. EXAM: PORTABLE CHEST 1 VIEW COMPARISON:  Yesterday FINDINGS: Endotracheal tube tip between clavicular heads and carina. Orogastric tube reaches the  stomach, which is gas distended. New airspace disease at the right base. Given history, question aspiration pneumonia. Normal heart size. No air leak or effusion. IMPRESSION: 1. New right basilar consolidation, likely pneumonia. Given the presentation this may reflect sequela of aspiration. 2. Endotracheal and orogastric tubes remain in good position. 3. The stomach is gas distended. Electronically Signed   By: Marnee SpringJonathon  Watts M.D.   On: 06/11/2015 07:19   Dg Chest Port 1 View  06/10/2015  CLINICAL DATA:  Respiratory failure. EXAM: PORTABLE CHEST 1 VIEW COMPARISON:  06/08/2015. FINDINGS: Endotracheal tube and NG tube in stable position. Heart size stable. Mild bibasilar subsegmental atelectasis. No pleural effusion or pneumothorax. IMPRESSION: 1. Lines and tubes in stable position. 2. Mild bibasilar subsegmental atelectasis. Electronically Signed   By: Maisie Fushomas  Register   On: 06/10/2015 07:17   Dg Chest Portable 1 View  06/09/2015  CLINICAL DATA:  Endotracheal tube placement.  Found unresponsive. EXAM: PORTABLE  CHEST 1 VIEW COMPARISON:  None. FINDINGS: The endotracheal tube is 3.3 cm from the carina. Lung volumes are low. The cardiomediastinal contours are normal. Pulmonary vasculature is normal. No consolidation, pleural effusion, or pneumothorax. No acute osseous abnormalities are seen. IMPRESSION: 1. Endotracheal tube 3.3 cm from the carina. 2. Hypo aerated but clear lungs. Electronically Signed   By: Rubye OaksMelanie  Ehinger M.D.   On: 06/09/2015 00:01   Dg Abd Portable 1v  06/09/2015  CLINICAL DATA:  Enteric tube placement.  Initial encounter. EXAM: PORTABLE ABDOMEN - 1 VIEW COMPARISON:  None. FINDINGS: The patient's enteric tube is seen ending overlying the body of the stomach. The visualized bowel gas pattern is unremarkable. Scattered air and stool filled loops of colon are seen; no abnormal dilatation of small bowel loops is seen to suggest small bowel obstruction. No free intra-abdominal air is identified, though evaluation for free air is limited on a single supine view. The visualized osseous structures are within normal limits; the sacroiliac joints are unremarkable in appearance. The visualized lung bases are essentially clear. IMPRESSION: Enteric tube seen ending overlying the body of the stomach. Electronically Signed   By: Roanna RaiderJeffery  Chang M.D.   On: 06/09/2015 04:08    ECG  06/08/2015 Sinus tachycardia without significant ST-T wave changes.   ASSESSMENT AND PLAN  1. AMS 2/2 drug overdose requiring intubation  - extubated on 10/19. Neurology recommended MRI when able  2. 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 Will start 3.125mg  coreg and plan to start limited echo in 1 week. If EF improve in 1 wk, no further workup  3. Suicidal attempt  - OD confirmed by family to be intentional, seen by psychiatry  4. RLL PNA: on abx  5. Elevated trop: likely demand ischemia related to tachycardia, AMS, acute resp failure, PNA and illicit drug  overdose  6. Hypokalemia: improved  Ramond DialSigned, Meng, Hao, PA-C 06/11/2015, 12:34 PM The patient was extubated earlier today. He is alert with flat affect. Denies any chest pain or dyspnea. Lungs reveal rhonchi at right base suggestive of pneumonia. Heart reveals no murmur gallop or rub. BP is low normal range. Rhythm is NSR.  For his depressed LVEF we will add low dose carvedilol and observe response. Plan a repeat echo in one week. Consider adding ACEi  later if BP allows and if EF has not improved.Agree with above assessment and plan.

## 2015-06-11 NOTE — Progress Notes (Signed)
Patient's pastor is in the room talking with him.

## 2015-06-11 NOTE — Progress Notes (Addendum)
Patient states that Merlene LaughterCelia is not his mother even though she had been previously listed as mom from recent admission in September 2016. Verified with Merlene LaughterCelia and she now states that she took him in at age 19 from church as foster parent even though she had previously said she was his mom when contacted.

## 2015-06-11 NOTE — Progress Notes (Signed)
Called to patient room by RN for patient threatening to leave AMA. On arrival, patient stated that he is "refusing further treatment." He stated that we have been lying to him and that we could not keep him there. Patient stated that his overdose was not intentional. On further questioning, patient was not able to state the risks of leaving the hospital. He stated "there are no risks." We it was explained to the patient that we were concerned about his heart function and pneumonia, patient stated that he "wouldn't be able to do intense exercise."  Patient does not currently have an IVC. Dr Shela CommonsJ was called by Dr Marchelle Gearingamaswamy who will contact the psych social worker for IVC paperwork.  Katina Degreealeb M. Jimmey RalphParker, MD [facility] Resident PGY-2 06/11/2015 2:38 PM

## 2015-06-11 NOTE — Clinical Social Work Note (Signed)
Psych CSW following for intentional overdose. This CSW signing off.   Derenda FennelBashira Lonya Johannesen, MSW, LCSWA [phone] 06/11/2015 7:40 AM

## 2015-06-11 NOTE — Progress Notes (Signed)
[facility]OC[facility] FOR AM LAB REPLACEMENT ONLY  The patient does apply for the [facility] Electrolyte Replacment Protocol based on the criteria listed below:   1. Is GFR >/= 40 ml/min? Yes.    Patient's GFR today is >60 2. Is urine output >/= 0.5 ml/kg/hr for the last 6 hours? Yes.   Patient's UOP is 0.83 ml/kg/hr 3. Is BUN < 60 mg/dL? Yes.    Patient's BUN today is 7 4. Abnormal electrolyte(s):Potassium 3.6 5. Ordered repletion with: Potassium per protocol 6. If a panic level lab has been reported, has the CCM MD in charge been notified? No..   Physician:    Orland DecLANTZY, Yomar Mejorado P 06/11/2015 4:57 AM

## 2015-06-11 NOTE — Progress Notes (Signed)
Subjective: patient remains intubated but weaning off the vent. Doing well. Able to follow all commands and gives a thumbs up when asked how he is doing. On 1.2 cg/Kg Precedex  Objective: Current vital signs: BP 124/68 mmHg  Pulse 95  Temp(Src) 99.8 F (37.7 C) (Oral)  Resp 26  Ht 5\' 10"  (1.778 m)  Wt 65.4 kg (144 lb 2.9 oz)  BMI 20.69 kg/m2  SpO2 98% Vital signs in last 24 hours: Temp:  [98.8 F (37.1 C)-102.7 F (39.3 C)] 99.8 F (37.7 C) (10/19 0900) Pulse Rate:  [78-99] 95 (10/19 0900) Resp:  [13-32] 26 (10/19 0900) BP: (106-138)/(57-85) 124/68 mmHg (10/19 0900) SpO2:  [95 %-100 %] 98 % (10/19 0900) FiO2 (%):  [30 %] 30 % (10/19 0900) Weight:  [65.4 kg (144 lb 2.9 oz)] 65.4 kg (144 lb 2.9 oz) (10/19 0500)  Intake/Output from previous day: 10/18 0701 - 10/19 0700 In: 4733.3 [I.V.:2843.3; NG/GT:1290; IV Piggyback:600] Out: 1230 [Urine:1230] Intake/Output this shift: Total I/O In: 548.4 [I.V.:238.4; NG/GT:110; IV Piggyback:200] Out: 200 [Urine:200] Nutritional status: Diet NPO time specified  Neurologic Exam: General: NAD Mental Status: Alert, oriented, thought content appropriate.  intubated.  Able to follow 3 step commands without difficulty. Cranial Nerves: II:  Visual fields grossly normal, pupils equal, round, reactive to light and accommodation III,IV, VI: ptosis not present, extra-ocular motions intact bilaterally V,VII: smile symmetric, facial light touch sensation normal bilaterally VIII: hearing normal bilaterally IX,X: uvula rises symmetrically XI: bilateral shoulder shrug XII: midline tongue extension without atrophy or fasciculations  Motor: Right : Upper extremity   5/5    Left:     Upper extremity   5/5  Lower extremity   5/5     Lower extremity   5/5 Tone and bulk:normal tone throughout; no atrophy noted Sensory: Pinprick and light touch intact throughout, bilaterally Deep Tendon Reflexes:  Right: Upper Extremity   Left: Upper extremity    biceps (C-5 to C-6) 2/4   biceps (C-5 to C-6) 2/4 tricep (C7) 2/4    triceps (C7) 2/4 Brachioradialis (C6) 2/4  Brachioradialis (C6) 2/4  Lower Extremity Lower Extremity  quadriceps (L-2 to L-4) 2/4   quadriceps (L-2 to L-4) 2/4 Achilles (S1) 2/4   Achilles (S1) 2/4  Plantars: Right: downgoing   Left: downgoing Cerebellar: normal finger-to-nose,  normal heel-to-shin test     Lab Results: Basic Metabolic Panel:  Recent Labs Lab 06/08/15 2231 06/09/15 0236 06/09/15 1100 06/10/15 0215 06/11/15 0240  NA 135 138 141 139 138  K 2.8* 2.7* 3.6 3.3* 3.6  CL 96* 100* 110 109 104  CO2 27 26 24 23 25   GLUCOSE 108* 91 97 115* 117*  BUN 7 5* <5* 6 7  CREATININE 1.00 0.81 0.82 0.69 0.84  CALCIUM 9.4 8.7* 8.8* 8.3* 8.2*  MG  --  2.0  --  1.9 1.8  PHOS  --  4.0  --   --  2.9    Liver Function Tests:  Recent Labs Lab 06/08/15 2231  AST 64*  ALT 49  ALKPHOS 66  BILITOT 1.0  PROT 7.5  ALBUMIN 4.9    Recent Labs Lab 06/08/15 2231  LIPASE 29   No results for input(s): AMMONIA in the last 168 hours.  CBC:  Recent Labs Lab 06/08/15 2231 06/09/15 0236 06/10/15 0215 06/11/15 0240  WBC 6.4 6.4 8.1 15.9*  NEUTROABS 3.6  --   --   --   HGB 15.6 13.1 12.0* 12.8*  HCT 43.2 35.9* 34.2* 36.4*  MCV 86.9 86.7 89.1 89.2  PLT 253 242 189 170    Cardiac Enzymes:  Recent Labs Lab 06/08/15 2336 06/09/15 0236 06/09/15 0840 06/09/15 1447 06/09/15 2030 06/10/15 0215  CKTOTAL 859* 771*  --   --   --  204  TROPONINI  --  0.43* 0.56* 0.34* 0.20*  --     Lipid Panel:  Recent Labs Lab 06/09/15 0236  TRIG 62    CBG:  Recent Labs Lab 06/10/15 1158 06/10/15 1541 06/10/15 1920 06/10/15 2342 06/11/15 0800  GLUCAP 99 93 96 95 127*    Microbiology: Results for orders placed or performed during the hospital encounter of 06/08/15  MRSA PCR Screening     Status: None   Collection Time: 06/09/15  2:07 AM  Result Value Ref Range Status   MRSA by PCR NEGATIVE  NEGATIVE Final    Comment:        The GeneXpert MRSA Assay (FDA approved for NASAL specimens only), is one component of a comprehensive MRSA colonization surveillance program. It is not intended to diagnose MRSA infection nor to guide or monitor treatment for MRSA infections.   Culture, respiratory (NON-Expectorated)     Status: None (Preliminary result)   Collection Time: 06/10/15  5:11 PM  Result Value Ref Range Status   Specimen Description TRACHEAL ASPIRATE  Final   Special Requests Normal  Final   Gram Stain   Final    ABUNDANT WBC PRESENT, PREDOMINANTLY PMN NO SQUAMOUS EPITHELIAL CELLS SEEN MODERATE GRAM POSITIVE COCCI IN PAIRS IN CLUSTERS Performed at [facility]    Culture   Final    Culture reincubated for better growth Performed at [facility]    Report Status PENDING  Incomplete    Coagulation Studies: No results for input(s): LABPROT, INR in the last 72 hours.  Imaging: Dg Chest Port 1 View  06/11/2015  CLINICAL DATA:  Acute respiratory failure.  Altered mental status. EXAM: PORTABLE CHEST 1 VIEW COMPARISON:  Yesterday FINDINGS: Endotracheal tube tip between clavicular heads and carina. Orogastric tube reaches the stomach, which is gas distended. New airspace disease at the right base. Given history, question aspiration pneumonia. Normal heart size. No air leak or effusion. IMPRESSION: 1. New right basilar consolidation, likely pneumonia. Given the presentation this may reflect sequela of aspiration. 2. Endotracheal and orogastric tubes remain in good position. 3. The stomach is gas distended. Electronically Signed   By: Marnee Spring M.D.   On: 06/11/2015 07:19   Dg Chest Port 1 View  06/10/2015  CLINICAL DATA:  Respiratory failure. EXAM: PORTABLE CHEST 1 VIEW COMPARISON:  06/08/2015. FINDINGS: Endotracheal tube and NG tube in stable position. Heart size stable. Mild bibasilar subsegmental atelectasis. No pleural effusion or pneumothorax.  IMPRESSION: 1. Lines and tubes in stable position. 2. Mild bibasilar subsegmental atelectasis. Electronically Signed   By: Maisie Fus  Register   On: 06/10/2015 07:17    Medications:  Scheduled: . antiseptic oral rinse  7 mL Mouth Rinse QID  . chlorhexidine gluconate  15 mL Mouth Rinse BID  . heparin  5,000 Units Subcutaneous 3 times per day  . pantoprazole (PROTONIX) IV  40 mg Intravenous QHS  . piperacillin-tazobactam (ZOSYN)  IV  3.375 g Intravenous Q8H  . potassium chloride  20 mEq Per Tube Q4H  . thiamine  100 mg Intravenous Daily  . vancomycin  1,000 mg Intravenous Q8H    Assessment/Plan:  19 year old admitted with encephalopathy thought to be related to drug use and possible unintentional overdose.  Mental status continues to improved. Patient is more responsive and has no focal deficits at this point. Weaning off the vent.  MRI planned for later today.   No further recommendations at this time.    Felicie Morn PA-C [facility] [phone]  06/11/2015, 9:43 AM   I personally participated in this patient's evaluation and management, including formulating the above clinical impression and management recommendations.  Venetia Maxon M.D. [facility] [phone]

## 2015-06-11 NOTE — Progress Notes (Signed)
Patient ID: Johnny Fuller, male   DOB: [birth year], 19 y.o.   MRN: [id number]  Spoke with Vickii PennaGina Jalloh, LCSW regarding immediate need of involuntary commitment petition for Johnny Fuller who is a 19 years old male admitted to medical intensive care unit after intentional overdose of medication and required intubation which was extubated today. Reportedly patient has been threatening to leave the hospital and he continued to be dangerous to himself without further medical evaluation and psychiatric evaluation and stabilization. Will recommend Haldol 2 mg and Ativan 1 mg twice daily for agitation and aggressive behavior as needed. Patient will be psychiatrically evaluated face-to-face tomorrow morning.   Dhanvi Boesen,JANARDHAHA R. 06/11/2015 2:31 PM

## 2015-06-11 NOTE — Progress Notes (Signed)
PULMONARY / CRITICAL CARE MEDICINE   Name: Johnny Fuller MRN: [id number] DOB: 09/24/1995    ADMISSION DATE:  06/08/2015 CONSULTATION DATE:  06/09/2015  REFERRING MD :  EDP  CHIEF COMPLAINT:  Unresponsive  INITIAL PRESENTATION: 19 y.o. M dropped off outside of hospital night of 06/08/15. In ED, had GCS of 3 therefore intubated for airway protection. PCCM called for admission of suspected overdose. Of note, [facility] for suicidal ideation.  STUDIES:  CXR 10/17 >>> neg CT head 10/17 >>> neg UDS: Amphetamines and benzos Echo 10/18 >>> EF 35-40%, no vegetations CXR 10/19 >>> RLL infiltrate  SIGNIFICANT EVENTS: 10/17 - Admit  SUBJECTIVE:  Intubated, sedated, arouses to voice  VITAL SIGNS: Temp:  [98.6 F (37 C)-102.7 F (39.3 C)] 102 F (38.9 C) (10/19 0600) Pulse Rate:  [78-99] 99 (10/19 0600) Resp:  [13-32] 18 (10/19 0600) BP: (112-138)/(55-85) 122/57 mmHg (10/19 0600) SpO2:  [95 %-100 %] 99 % (10/19 0600) FiO2 (%):  [30 %] 30 % (10/19 0424) Weight:  [144 lb 2.9 oz (65.4 kg)] 144 lb 2.9 oz (65.4 kg) (10/19 0500) HEMODYNAMICS:   VENTILATOR SETTINGS: Vent Mode:  [-] PRVC FiO2 (%):  [30 %] 30 % Set Rate:  [14 bmp] 14 bmp Vt Set:  [530 mL] 530 mL PEEP:  [5 cmH20] 5 cmH20 Pressure Support:  [5 cmH20] 5 cmH20 Plateau Pressure:  [17 cmH20-21 cmH20] 19 cmH20 INTAKE / OUTPUT:  Intake/Output Summary (Last 24 hours) at 06/11/15 0746 Last data filed at 06/11/15 0600  Gross per 24 hour  Intake 4551.4 ml  Output   1230 ml  Net 3321.4 ml    PHYSICAL EXAMINATION: General:  18yo male, intubated and sedated Neuro:  Arouses to verbal stimuli, follows some commands HEENT:  NCAT Cardiovascular:  RRR, s1s2 normal Lungs:  Clear bilaterally Abdomen:  +BS, S, NT, ND Musculoskeletal:  No cyanosis or clubbing Skin:  Intact, dry  LABS:  CBC  Recent Labs Lab 06/09/15 0236 06/10/15 0215 06/11/15 0240  WBC 6.4 8.1 15.9*  HGB 13.1 12.0* 12.8*  HCT  35.9* 34.2* 36.4*  PLT 242 189 170   Coag's No results for input(s): APTT, INR in the last 168 hours. BMET  Recent Labs Lab 06/09/15 1100 06/10/15 0215 06/11/15 0240  NA 141 139 138  K 3.6 3.3* 3.6  CL 110 109 104  CO2 24 23 25   BUN <5* 6 7  CREATININE 0.82 0.69 0.84  GLUCOSE 97 115* 117*   Electrolytes  Recent Labs Lab 06/09/15 0236 06/09/15 1100 06/10/15 0215 06/11/15 0240  CALCIUM 8.7* 8.8* 8.3* 8.2*  MG 2.0  --  1.9 1.8  PHOS 4.0  --   --  2.9   Sepsis Markers  Recent Labs Lab 06/08/15 2258 06/10/15 1531 06/10/15 1820  LATICACIDVEN 1.96 1.1 1.8  PROCALCITON  --  <0.10  --    ABG  Recent Labs Lab 06/08/15 2344 06/09/15 0424  PHART 7.491* 7.513*  PCO2ART 39.1 31.3*  PO2ART 271.0* 208*   Liver Enzymes  Recent Labs Lab 06/08/15 2231  AST 64*  ALT 49  ALKPHOS 66  BILITOT 1.0  ALBUMIN 4.9   Cardiac Enzymes  Recent Labs Lab 06/09/15 0840 06/09/15 1447 06/09/15 2030  TROPONINI 0.56* 0.34* 0.20*   Glucose  Recent Labs Lab 06/09/15 2030 06/10/15 0741 06/10/15 1158 06/10/15 1541 06/10/15 1920 06/10/15 2342  GLUCAP 96 93 99 93 96 95    Imaging Dg Chest Port 1 View  06/11/2015  CLINICAL DATA:  Acute respiratory failure.  Altered mental status. EXAM: PORTABLE CHEST 1 VIEW COMPARISON:  Yesterday FINDINGS: Endotracheal tube tip between clavicular heads and carina. Orogastric tube reaches the stomach, which is gas distended. New airspace disease at the right base. Given history, question aspiration pneumonia. Normal heart size. No air leak or effusion. IMPRESSION: 1. New right basilar consolidation, likely pneumonia. Given the presentation this may reflect sequela of aspiration. 2. Endotracheal and orogastric tubes remain in good position. 3. The stomach is gas distended. Electronically Signed   By: Marnee Spring M.D.   On: 06/11/2015 07:19     ASSESSMENT / PLAN:   NEUROLOGIC A:  Acute encephalopathy - likely due to overdose; UDS  positive for amphetamines and benzo's. Recent ED presentation and [facility] admission due to suicidal ideation. Hx anxiety / depression, substance abuse, ETOH use. P:  Sedation: Propofol gtt / fentanyl PRN. RASS goal: 0 to -1. Daily WUA. Suicide precautions. Hold outpatient clonazepam, lexapro, trazodone. Thiamine / folate. Psych will see once no longer intubated  PULMONARY OETT 10/17 >>> A: VDRF due to inability to protect airway in the setting of suspected overdose. HCAP vs aspiration PNA P:  Full mechanical support, wean as able. VAP prevention measures. CXR in AM. Abx per ID section  CARDIOVASCULAR A:  No acute issues. Elevated troponin - trending down HFrEF - EF 35-40% on echo 10/18 P:  Monitor hemodynamics.  RENAL A:  Hypokalemia. Mildly elevated CK - not in range for rhabdo. P:  NS @ 100. Trend BMP  GASTROINTESTINAL A:  GI prophylaxis. Nutrition. P:  SUP: Pantoprazole. Tube Feeds  HEMATOLOGIC A:  VTE Prophylaxis. P:  SCD's / Heparin. Monitor CBC  INFECTIOUS A:  HCAP vs aspiration PNA P:  Resp culture 10/18 >>> Blood culture 10/18 >>>  Vanc 10/19 >>> Zosyn 10/18 >>>  ENDOCRINE A:  No acute issues.  P:  SSI if glucose consistently > 180.  Family updated: Mother updated at bedside 10/18.   Interdisciplinary Family Meeting v Palliative Care Meeting: Due by: 10/23.  Katina Degree. Jimmey Ralph, MD [facility] Resident PGY-2 06/11/2015 7:46 AM

## 2015-06-12 ENCOUNTER — Inpatient Hospital Stay (HOSPITAL_COMMUNITY): Payer: Medicaid Other

## 2015-06-12 ENCOUNTER — Encounter (HOSPITAL_COMMUNITY): Payer: Self-pay | Admitting: *Deleted

## 2015-06-12 DIAGNOSIS — J189 Pneumonia, unspecified organism: Secondary | ICD-10-CM | POA: Diagnosis present

## 2015-06-12 DIAGNOSIS — R45851 Suicidal ideations: Secondary | ICD-10-CM

## 2015-06-12 DIAGNOSIS — T424X2A Poisoning by benzodiazepines, intentional self-harm, initial encounter: Secondary | ICD-10-CM | POA: Diagnosis present

## 2015-06-12 LAB — CBC
HEMATOCRIT: 34.6 % — AB (ref 39.0–52.0)
HEMOGLOBIN: 12.2 g/dL — AB (ref 13.0–17.0)
MCH: 31.3 pg (ref 26.0–34.0)
MCHC: 35.3 g/dL (ref 30.0–36.0)
MCV: 88.7 fL (ref 78.0–100.0)
Platelets: 195 10*3/uL (ref 150–400)
RBC: 3.9 MIL/uL — ABNORMAL LOW (ref 4.22–5.81)
RDW: 12.2 % (ref 11.5–15.5)
WBC: 14 10*3/uL — ABNORMAL HIGH (ref 4.0–10.5)

## 2015-06-12 LAB — TRIGLYCERIDES: Triglycerides: 84 mg/dL (ref <150)

## 2015-06-12 LAB — BASIC METABOLIC PANEL
Anion gap: 7 (ref 5–15)
BUN: 5 mg/dL — ABNORMAL LOW (ref 6–20)
CHLORIDE: 104 mmol/L (ref 101–111)
CO2: 25 mmol/L (ref 22–32)
CREATININE: 0.79 mg/dL (ref 0.61–1.24)
Calcium: 7.9 mg/dL — ABNORMAL LOW (ref 8.9–10.3)
GFR calc non Af Amer: >60 mL/min (ref >60)
Glucose, Bld: 119 mg/dL — ABNORMAL HIGH (ref 65–99)
Potassium: 3.6 mmol/L (ref 3.5–5.1)
Sodium: 136 mmol/L (ref 135–145)

## 2015-06-12 LAB — GLUCOSE, CAPILLARY
GLUCOSE-CAPILLARY: 107 mg/dL — AB (ref 65–99)
Glucose-Capillary: 78 mg/dL (ref 65–99)
Glucose-Capillary: 162 mg/dL — ABNORMAL HIGH (ref 65–99)

## 2015-06-12 LAB — PROCALCITONIN: PROCALCITONIN: 2.49 ng/mL

## 2015-06-12 LAB — MAGNESIUM: Magnesium: 2 mg/dL (ref 1.7–2.4)

## 2015-06-12 LAB — PHOSPHORUS: PHOSPHORUS: 2.2 mg/dL — AB (ref 2.5–4.6)

## 2015-06-12 LAB — VANCOMYCIN, TROUGH: Vancomycin Tr: 13 ug/mL (ref 10.0–20.0)

## 2015-06-12 MED ORDER — CLONAZEPAM 1 MG PO TABS
1.0000 mg | ORAL_TABLET | Freq: Three times a day (TID) | ORAL | Status: DC | PRN
  Administered 2015-06-13: 1 mg via ORAL
  Filled 2015-06-12: qty 1

## 2015-06-12 MED ORDER — VITAMIN B-1 100 MG PO TABS
100.0000 mg | ORAL_TABLET | Freq: Every day | ORAL | Status: DC
  Administered 2015-06-12 – 2015-06-14 (×3): 100 mg via ORAL
  Filled 2015-06-12 (×3): qty 1

## 2015-06-12 MED ORDER — TRAZODONE HCL 50 MG PO TABS
50.0000 mg | ORAL_TABLET | Freq: Every day | ORAL | Status: DC
  Administered 2015-06-12 – 2015-06-13 (×2): 50 mg via ORAL
  Filled 2015-06-12 (×3): qty 1

## 2015-06-12 MED ORDER — POTASSIUM PHOSPHATES 15 MMOLE/5ML IV SOLN
10.0000 mmol | Freq: Once | INTRAVENOUS | Status: AC
  Administered 2015-06-12: 10 mmol via INTRAVENOUS
  Filled 2015-06-12: qty 3.33

## 2015-06-12 NOTE — Clinical Social Work Psych Note (Signed)
Psych CSW spoke with patient's mother, Merlene LaughterCelia.  Full assessment to follow.  Vickii PennaGina Traum, LCSW [phone]  [facility] Licensed Clinical Social Worker

## 2015-06-12 NOTE — Progress Notes (Signed)
Patient Name: Johnny Fuller Date of Encounter: 06/12/2015     Principal Problem:   Intentional benzodiazepine overdose (HCC) Active Problems:   Acute respiratory failure (HCC)   Encephalopathy   HCAP (healthcare-associated pneumonia)    SUBJECTIVE  Denies any chest pain or shortness of breath.  Not coughing any significant sputum at this point.  CURRENT MEDS . carvedilol  3.125 mg Oral BID WC  . heparin  5,000 Units Subcutaneous 3 times per day  . potassium phosphate IVPB (mmol)  10 mmol Intravenous Once  . thiamine  100 mg Oral Daily  . traZODone  50 mg Oral QHS  . vancomycin  1,000 mg Intravenous Q8H    OBJECTIVE  Filed Vitals:   06/12/15 1000 06/12/15 1100 06/12/15 1200 06/12/15 1204  BP: 123/63 116/60 115/58   Pulse: 90 78 78   Temp: 99.7 F (37.6 C)   98 F (36.7 C)  TempSrc:    Oral  Resp: 25 18    Height:      Weight:      SpO2: 95% 95% 94%     Intake/Output Summary (Last 24 hours) at 06/12/15 1305 Last data filed at 06/12/15 1248  Gross per 24 hour  Intake 1911.86 ml  Output   3135 ml  Net -1223.14 ml   Filed Weights   06/10/15 0400 06/11/15 0500 06/12/15 0500  Weight: 141 lb 12.1 oz (64.3 kg) 144 lb 2.9 oz (65.4 kg) 143 lb 11.8 oz (65.2 kg)    PHYSICAL EXAM  General: Pleasant, NAD. Neuro: Alert and oriented X 3. Moves all extremities spontaneously. Psych: Normal affect. HEENT:  Normal  Neck: Supple without bruits or JVD. Lungs:  Resp regular and unlabored, minimal rhonchi at right base Heart: RRR no s3, s4, or murmurs. Abdomen: Soft, non-tender, non-distended, BS + x 4.  Extremities: No clubbing, cyanosis or edema. DP/PT/Radials 2+ and equal bilaterally.  Accessory Clinical Findings  CBC  Recent Labs  06/11/15 0240 06/12/15 0056  WBC 15.9* 14.0*  HGB 12.8* 12.2*  HCT 36.4* 34.6*  MCV 89.2 88.7  PLT 170 195   Basic Metabolic Panel  Recent Labs  06/11/15 0240 06/12/15 0056  NA 138 136  K 3.6 3.6  CL 104 104  CO2 25 25   GLUCOSE 117* 119*  BUN 7 5*  CREATININE 0.84 0.79  CALCIUM 8.2* 7.9*  MG 1.8 2.0  PHOS 2.9 2.2*   Liver Function Tests No results for input(s): AST, ALT, ALKPHOS, BILITOT, PROT, ALBUMIN in the last 72 hours. No results for input(s): LIPASE, AMYLASE in the last 72 hours. Cardiac Enzymes  Recent Labs  06/09/15 1447 06/09/15 2030 06/10/15 0215  CKTOTAL  --   --  204  TROPONINI 0.34* 0.20*  --    BNP Invalid input(s): POCBNP D-Dimer No results for input(s): DDIMER in the last 72 hours. Hemoglobin A1C No results for input(s): HGBA1C in the last 72 hours. Fasting Lipid Panel  Recent Labs  06/12/15 0056  TRIG 84   Thyroid Function Tests No results for input(s): TSH, T4TOTAL, T3FREE, THYROIDAB in the last 72 hours.  Invalid input(s): FREET3  TELE  Normal sinus rhythm  ECG    Radiology/Studies  Ct Head Wo Contrast  06/09/2015  CLINICAL DATA:  Dropped off at hospital, unresponsive. Initial encounter. EXAM: CT HEAD WITHOUT CONTRAST TECHNIQUE: Contiguous axial images were obtained from the base of the skull through the vertex without intravenous contrast. COMPARISON:  None. FINDINGS: There is no evidence of acute infarction,  mass lesion, or intra- or extra-axial hemorrhage on CT. The posterior fossa, including the cerebellum, brainstem and fourth ventricle, is within normal limits. The third and lateral ventricles, and basal ganglia are unremarkable in appearance. The cerebral hemispheres are symmetric in appearance, with normal gray-white differentiation. No mass effect or midline shift is seen. There is no evidence of fracture; visualized osseous structures are unremarkable in appearance. The visualized portions of the orbits are within normal limits. The paranasal sinuses and mastoid air cells are well-aerated. No significant soft tissue abnormalities are seen. IMPRESSION: Unremarkable noncontrast CT of the head. Electronically Signed   By: Roanna Raider M.D.   On:  06/09/2015 00:23   Dg Chest Port 1 View  06/11/2015  CLINICAL DATA:  Acute respiratory failure.  Altered mental status. EXAM: PORTABLE CHEST 1 VIEW COMPARISON:  Yesterday FINDINGS: Endotracheal tube tip between clavicular heads and carina. Orogastric tube reaches the stomach, which is gas distended. New airspace disease at the right base. Given history, question aspiration pneumonia. Normal heart size. No air leak or effusion. IMPRESSION: 1. New right basilar consolidation, likely pneumonia. Given the presentation this may reflect sequela of aspiration. 2. Endotracheal and orogastric tubes remain in good position. 3. The stomach is gas distended. Electronically Signed   By: Marnee Spring M.D.   On: 06/11/2015 07:19   Dg Chest Port 1 View  06/10/2015  CLINICAL DATA:  Respiratory failure. EXAM: PORTABLE CHEST 1 VIEW COMPARISON:  06/08/2015. FINDINGS: Endotracheal tube and NG tube in stable position. Heart size stable. Mild bibasilar subsegmental atelectasis. No pleural effusion or pneumothorax. IMPRESSION: 1. Lines and tubes in stable position. 2. Mild bibasilar subsegmental atelectasis. Electronically Signed   By: Maisie Fus  Register   On: 06/10/2015 07:17   Dg Chest Portable 1 View  06/09/2015  CLINICAL DATA:  Endotracheal tube placement.  Found unresponsive. EXAM: PORTABLE CHEST 1 VIEW COMPARISON:  None. FINDINGS: The endotracheal tube is 3.3 cm from the carina. Lung volumes are low. The cardiomediastinal contours are normal. Pulmonary vasculature is normal. No consolidation, pleural effusion, or pneumothorax. No acute osseous abnormalities are seen. IMPRESSION: 1. Endotracheal tube 3.3 cm from the carina. 2. Hypo aerated but clear lungs. Electronically Signed   By: Rubye Oaks M.D.   On: 06/09/2015 00:01   Dg Abd Portable 1v  06/09/2015  CLINICAL DATA:  Enteric tube placement.  Initial encounter. EXAM: PORTABLE ABDOMEN - 1 VIEW COMPARISON:  None. FINDINGS: The patient's enteric tube is seen  ending overlying the body of the stomach. The visualized bowel gas pattern is unremarkable. Scattered air and stool filled loops of colon are seen; no abnormal dilatation of small bowel loops is seen to suggest small bowel obstruction. No free intra-abdominal air is identified, though evaluation for free air is limited on a single supine view. The visualized osseous structures are within normal limits; the sacroiliac joints are unremarkable in appearance. The visualized lung bases are essentially clear. IMPRESSION: Enteric tube seen ending overlying the body of the stomach. Electronically Signed   By: Roanna Raider M.D.   On: 06/09/2015 04:08    ASSESSMENT AND PLAN 1. AMS 2/2 drug overdose requiring intubation - extubated on 10/19.   2. 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 Will start 3.125mg  coreg and plan to start limited echo in 1 week. If EF improve in 1 wk, no further workup  3. Suicidal attempt - OD confirmed by family to be intentional, seen by psychiatry  4. RLL PNA: on abx  5. Elevated trop: likely demand ischemia related to tachycardia, AMS, acute resp failure, PNA and illicit drug overdose  6. Hypokalemia: improved  Plan: Continue low dose carvedilol  Signed, Cassell Clement MD

## 2015-06-12 NOTE — Progress Notes (Signed)
Subjective: Extubated successfully. Patient was threatening to leave AMA yesterday. He is much calmer today and corporative.  Objective: Current vital signs: BP 130/76 mmHg  Pulse 76  Temp(Src) 99.1 F (37.3 C) (Core (Comment))  Resp 23  Ht 5\' 10"  (1.778 m)  Wt 65.2 kg (143 lb 11.8 oz)  BMI 20.62 kg/m2  SpO2 97%  Neurologic Exam: Alert and in no acute distress. Patient was well-oriented to time as well as place. There was no receptive or expressive aphasia. He followed commands well. Pupils were equal and reacted normally to light. Extraocular movements were full and conjugate. Visual fields were normal. No facial weakness was noted. Strength of extremities was normal and symmetrical throughout. Deep tendon reflexes were 1+ and symmetrical. Coordination was normal.  Medications: I have reviewed the patient's current medications.  Assessment/Plan: 19 year old who presented with unresponsiveness thought to likely be due to unintentional drug intoxication. Encephalopathy has clinically resolved. Patient has no focal deficits at this point.  Recommend repeat EEG to rule out residual encephalopathy, as well as MRI of the brain to rule out evidence of hypoxic brain injury. Patient indicated willingness to cooperate with obtaining these studies.  If above studies abnormal no further neurological intervention would be indicated.  C.R. Roseanne RenoStewart, MD [facility] [phone]  06/12/2015  9:29 AM

## 2015-06-12 NOTE — Progress Notes (Signed)
In conversation with the patient, he reports to have taken "like 20 tylenol PM and 2 klonopin" in an attempt to commit suicide. Patient has requested a consult with social work to discuss a possible alternative behavioral health hospital.

## 2015-06-12 NOTE — Progress Notes (Signed)
Johnny HarmanCraig E Fuller [id number]  Transfer Data: 06/12/2015 3:22 PM  Attending Provider: Kalman ShanMurali Ramaswamy, MD  PCP:No primary care provider on file.  Code Status: Full  Johnny HarmanCraig E Fuller is a 19 y.o. male patient transferred from 48M  -No acute distress noted.  -No complaints of shortness of breath.  -No complaints of chest pain.  Cardiac Monitoring:  Box # 01 in place.  Cardiac monitor yields:normal sinus rhythm.  Blood pressure 130/62, pulse 94, temperature 98.4 F (36.9 C), temperature source Oral, resp. rate 20, height 5\' 10"  (1.778 m), weight 65.2 kg (143 lb 11.8 oz), SpO2 97 %.  ?  IV Fluids: IV in place, occlusive dsg intact without redness, IV cath antecubital right, condition patent and no redness  normal saline.  Allergies: Sulfa antibiotics  Past Medical History:  has no past medical history on file.  Past Surgical History:  has past surgical history that includes No past surgeries.  Social History:  reports that he has been smoking.  He does not have any smokeless tobacco history on file. He reports that he uses illicit drugs. He reports that he does not drink alcohol.  Skin: intact  Patient orientated to room/ Suicide Sitter at bedside. IVC papers place in chart. Pt appears calm at this time. Call light within reach. Patient able to voice and demonstrate understanding of unit orientation instructions.  Will continue to evaluate and treat per MD orders.

## 2015-06-12 NOTE — Progress Notes (Signed)
PULMONARY / CRITICAL CARE MEDICINE   Name: Johnny Fuller MRN: [id number] DOB: 1995/12/15    ADMISSION DATE:  06/08/2015 CONSULTATION DATE:  06/09/2015  REFERRING MD :  EDP  CHIEF COMPLAINT:  Unresponsive  INITIAL PRESENTATION: 19 y.o. M dropped off outside of hospital night of 06/08/15. In ED, had GCS of 3 therefore intubated for airway protection. PCCM called for admission of suspected overdose. Of note, he was seen in [facility] ED 06/01/15 for suicidal ideation.  STUDIES:  CXR 10/17 >>> neg CT head 10/17 >>> neg UDS: Amphetamines and benzos Echo 10/18 >>> EF 35-40%, no vegetations CXR 10/19 >>> RLL infiltrate  SIGNIFICANT EVENTS: 10/17 - Admit 10/19 - Extubated  SUBJECTIVE:  Sleeping, arouses to voice. No complaints.  VITAL SIGNS: Temp:  [99.1 F (37.3 C)-102.2 F (39 C)] 99.1 F (37.3 C) (10/20 0700) Pulse Rate:  [76-115] 76 (10/20 0700) Resp:  [21-37] 23 (10/20 0700) BP: (76-141)/(55-79) 130/76 mmHg (10/20 0700) SpO2:  [90 %-100 %] 97 % (10/20 0700) FiO2 (%):  [30 %] 30 % (10/19 0900) Weight:  [143 lb 11.8 oz (65.2 kg)] 143 lb 11.8 oz (65.2 kg) (10/20 0500) HEMODYNAMICS:   VENTILATOR SETTINGS: Vent Mode:  [-] PSV;CPAP FiO2 (%):  [30 %] 30 % PEEP:  [5 cmH20] 5 cmH20 Pressure Support:  [5 cmH20] 5 cmH20 INTAKE / OUTPUT:  Intake/Output Summary (Last 24 hours) at 06/12/15 0741 Last data filed at 06/12/15 0700  Gross per 24 hour  Intake 2725.21 ml  Output   3585 ml  Net -859.79 ml    PHYSICAL EXAMINATION: General:  18yo male, NAD Neuro:  Alert, oriented HEENT:  NCAT Cardiovascular:  RRR, s1s2 normal Lungs:  Coarse BS in right lower lung fields.  Abdomen:  +BS, S, NT, ND Musculoskeletal:  No cyanosis or clubbing Skin:  Intact, dry  LABS:  CBC  Recent Labs Lab 06/10/15 0215 06/11/15 0240 06/12/15 0056  WBC 8.1 15.9* 14.0*  HGB 12.0* 12.8* 12.2*  HCT 34.2* 36.4* 34.6*  PLT 189 170 195   Coag's No results for input(s): APTT, INR in the last  168 hours. BMET  Recent Labs Lab 06/10/15 0215 06/11/15 0240 06/12/15 0056  NA 139 138 136  K 3.3* 3.6 3.6  CL 109 104 104  CO2 BUN 6 7 5*  CREATININE 0.69 0.84 0.79  GLUCOSE 115* 117* 119*   Electrolytes  Recent Labs Lab 06/09/15 0236  06/10/15 0215 06/11/15 0240 06/12/15 0056  CALCIUM 8.7*  < > 8.3* 8.2* 7.9*  MG 2.0  --  1.9 1.8 2.0  PHOS 4.0  --   --  2.9 2.2*  < > = values in this interval not displayed. Sepsis Markers  Recent Labs Lab 06/08/15 2258 06/10/15 1531 06/10/15 1820 06/11/15 0240  LATICACIDVEN 1.96 1.1 1.8  --   PROCALCITON  --  <0.10  --  2.31   ABG  Recent Labs Lab 06/08/15 2344 06/09/15 0424  PHART 7.491* 7.513*  PCO2ART 39.1 31.3*  PO2ART 271.0* 208*   Liver Enzymes  Recent Labs Lab 06/08/15 2231  AST 64*  ALT 49  ALKPHOS 66  BILITOT 1.0  ALBUMIN 4.9   Cardiac Enzymes  Recent Labs Lab 06/09/15 0840 06/09/15 1447 06/09/15 2030  TROPONINI 0.56* 0.34* 0.20*   Glucose  Recent Labs Lab 06/10/15 1158 06/10/15 1541 06/10/15 1920 06/10/15 2342 06/11/15 0800 06/11/15 1107  GLUCAP 99 93 96 95 127* 113*    Imaging No results found.   ASSESSMENT /  PLAN:   NEUROLOGIC A:  Acute encephalopathy - likely due to overdose; UDS positive for amphetamines and benzo's - RESOLVED [facility]. Hx anxiety / depression, substance abuse, ETOH use. P:  Suicide precautions. Consider restarting outpatient clonazepam, lexapro, trazodone. Thiamine / folate. Psychiatry involved  PULMONARY OETT 10/17 >>> A: VDRF due to inability to protect airway in the setting of suspected overdose - RESOLVED HCAP vs aspiration PNA P:  Supplemental oxygen as needed Abx per ID section  CARDIOVASCULAR A:  No acute issues. 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 Start coreg per  cardiology Monitor blood culture and clinical signs of endocarditis   RENAL A:  Hypokalemia. Mildly elevated CK - not in range for rhabdo. P:  NS @ 100. Trend BMP  GASTROINTESTINAL A:  GI prophylaxis. Nutrition. P:  Advance diet as tolerated  HEMATOLOGIC A:  VTE Prophylaxis. P:  SCD's / Heparin. Monitor CBC  INFECTIOUS A:  HCAP vs aspiration PNA P:  Resp culture 10/18 >>> Moderate staph aureus  Blood culture 10/18 >>>  Vanc 10/19 >>> Zosyn 10/18 >>>10/20  ENDOCRINE A:  No acute issues.  P:  SSI if glucose consistently > 180.  Family updated: Emergency contact (not patient's actual mother) updated at bedside 10/19.    Interdisciplinary Family Meeting v Palliative Care Meeting: Due by: 10/23.  Katina Degreealeb M. Jimmey RalphParker, MD [facility] Resident PGY-2 06/12/2015 7:41 AM

## 2015-06-12 NOTE — Consult Note (Signed)
[facility] Face-to-Face Psychiatry Consult   Reason for Consult:  AMS and intentional overdose Referring Physician:  Dr. Chase Caller Patient Identification: Johnny Fuller MRN:  [id number] Principal Diagnosis: Intentional benzodiazepine overdose [facility]) Diagnosis:   Patient Active Problem List   Diagnosis Date Noted  . Intentional benzodiazepine overdose ([place]) [T42.4X2A] 06/12/2015  . Encephalopathy [G93.40]   . Encephalopathy acute [G93.40] 06/10/2015  . Coma ([place]) [R40.20] 06/09/2015  . Acute respiratory failure ([place]) [J96.00] 06/09/2015    Total Time spent with patient: 1 hour  Subjective:   Johnny Fuller is a 19 y.o. male patient admitted with acute respiratory failure and status post intentional overdose of benzodiazepine.  HPI:  Johnny Fuller is a 19 y.o. Male seen, chart reviewed and case discussed with admitting physician, internal medicine resident, staff RN and psychiatric social service for face-to-face psychiatry consultation and evaluation. Patient is able to identify this provider [facility] for attention deficit hyperactivity disorder and oppositional defiant disorder. Patient reported he never trusted this provider because this provider sided with his custodians. His custodians at that time was level III group home in [place]. Patient also told me that he does not like taking medications now aor in the past. Patient requested second opinion from another psychiatrist before giving first opinion by this provider. Patient is calm and partially cooperative during my visit. Patient endorses intentional overdose of benzodiazepine especially clonazepam but not given the exact amount he overdosed, also reportedly taken Tylenol PM four or 5 tablets to control his anxiety. Patient denied stimulant abuse and stated he does not know how his urine drug screen positive for amphetamines. Patient reported after overdose he felt blurred  eyes which scared him, so he contacted one of his friend to bring him to the emergency department. Reportedly his friend brought him to the emergency department in an unresponsive state but did not stay with him. Patient was stayed with the foster mother from a 32-18 since then he has been living by himself in an apartment. Reportedly he dropped out of [facility] for unknown reasons. Patient is also involved with drug of abuse along with friend who is a drug abuser or drug dealer. He was subsequently intubated for airway protection. UDS positive for amphetamines and benzo's. CT of the head negative for acute process. K low at 2.8. Otherwise, rest of labs unremarkable. Patient denies current suicidal ideation, homicidal ideation, intention or plans and also denies psychotic symptoms.  Past Psychiatric History: He was seen in [facility] ED 06/01/15 for suicidal ideation. At that time, he had used crystal meth, trazodone, and alcohol the day prior and informed EDP that he had suicidal ideation (with no actual attempt) and was thinking about hanging himself. 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 He was deemed stable and discharged on 06/02/15 with plans for outpatient follow up with psychiatry.  Risk to Self:   Risk to Others:   Prior Inpatient Therapy:   Prior Outpatient Therapy:    Past Medical History: No past medical history on file.  Past Surgical History  Procedure Laterality Date  . No past surgeries     Family History:  Family History  Problem Relation Age of Onset  . CAD Neg Hx    Family Psychiatric  History: Patient was separated from his mother when he was young and later temporarily paced with his biological father's custody but he did not  get along which resulted he was placed in a group home and then foster home. Social History:  History  Alcohol Use No     History  Drug Use  . Yes    Social History    Social History  . Marital Status: Single    Spouse Name: N/A  . Number of Children: N/A  . Years of Education: N/A   Social History Main Topics  . Smoking status: Current Some Day Smoker  . Smokeless tobacco: Not on file  . Alcohol Use: No  . Drug Use: Yes  . Sexual Activity: Not on file   Other Topics Concern  . Not on file   Social History Narrative  . No narrative on file   Additional Social History:                          Allergies:   Allergies  Allergen Reactions  . Sulfa Antibiotics Other (See Comments)    Unknown, childhood reaction    Labs:  Results for orders placed or performed during the hospital encounter of 06/08/15 (from the past 48 hour(s))  Glucose, capillary     Status: None   Collection Time: 06/10/15 11:58 AM  Result Value Ref Range   Glucose-Capillary 99 65 - 99 mg/dL  Culture, Urine     Status: None   Collection Time: 06/10/15  3:29 PM  Result Value Ref Range   Specimen Description URINE, CATHETERIZED    Special Requests NONE    Culture NO GROWTH 1 DAY    Report Status 06/11/2015 FINAL   Culture, blood (routine x 2)     Status: None (Preliminary result)   Collection Time: 06/10/15  3:31 PM  Result Value Ref Range   Specimen Description BLOOD RIGHT HAND    Special Requests BOTTLES DRAWN AEROBIC ONLY 5CC    Culture NO GROWTH < 24 HOURS    Report Status PENDING   Lactic acid, plasma     Status: None   Collection Time: 06/10/15  3:31 PM  Result Value Ref Range   Lactic Acid, Venous 1.1 0.5 - 2.0 mmol/L  Procalcitonin - Baseline     Status: None   Collection Time: 06/10/15  3:31 PM  Result Value Ref Range   Procalcitonin <0.10 ng/mL    Comment:        Interpretation: PCT (Procalcitonin) <= 0.5 ng/mL: Systemic infection (sepsis) is not likely. Local bacterial infection is possible. (NOTE)         ICU PCT Algorithm               Non ICU PCT Algorithm    ----------------------------     ------------------------------          PCT < 0.25 ng/mL                 PCT < 0.1 ng/mL     Stopping of antibiotics            Stopping of antibiotics       strongly encouraged.               strongly encouraged.    ----------------------------     ------------------------------       PCT level decrease by               PCT < 0.25 ng/mL       >= 80% from peak PCT       OR PCT 0.25 -  0.5 ng/mL          Stopping of antibiotics                                             encouraged.     Stopping of antibiotics           encouraged.    ----------------------------     ------------------------------       PCT level decrease by              PCT >= 0.25 ng/mL       < 80% from peak PCT        AND PCT >= 0.5 ng/mL            Continuin g antibiotics                                              encouraged.       Continuing antibiotics            encouraged.    ----------------------------     ------------------------------     PCT level increase compared          PCT > 0.5 ng/mL         with peak PCT AND          PCT >= 0.5 ng/mL             Escalation of antibiotics                                          strongly encouraged.      Escalation of antibiotics        strongly encouraged.   Culture, blood (routine x 2)     Status: None (Preliminary result)   Collection Time: 06/10/15  3:37 PM  Result Value Ref Range   Specimen Description BLOOD RIGHT ARM    Special Requests IN PEDIATRIC BOTTLE  1CC    Culture NO GROWTH < 24 HOURS    Report Status PENDING   Glucose, capillary     Status: None   Collection Time: 06/10/15  3:41 PM  Result Value Ref Range   Glucose-Capillary 93 65 - 99 mg/dL  Culture, respiratory (NON-Expectorated)     Status: None (Preliminary result)   Collection Time: 06/10/15  5:11 PM  Result Value Ref Range   Specimen Description TRACHEAL ASPIRATE    Special Requests Normal    Gram Stain      ABUNDANT WBC PRESENT, PREDOMINANTLY PMN NO SQUAMOUS EPITHELIAL CELLS SEEN MODERATE GRAM POSITIVE COCCI IN PAIRS  IN CLUSTERS Performed at [facility]    Culture      MODERATE STAPHYLOCOCCUS AUREUS Note: RIFAMPIN AND GENTAMICIN SHOULD NOT BE USED AS SINGLE DRUGS FOR TREATMENT OF STAPH INFECTIONS. Performed at [facility]    Report Status PENDING   Lactic acid, plasma     Status: None   Collection Time: 06/10/15  6:20 PM  Result Value Ref Range   Lactic Acid, Venous 1.8 0.5 - 2.0 mmol/L  Glucose, capillary     Status: None   Collection Time: 06/10/15  7:20 PM  Result  Value Ref Range   Glucose-Capillary 96 65 - 99 mg/dL  Glucose, capillary     Status: None   Collection Time: 06/10/15 11:42 PM  Result Value Ref Range   Glucose-Capillary 95 65 - 99 mg/dL  CBC     Status: Abnormal   Collection Time: 06/11/15  2:40 AM  Result Value Ref Range   WBC 15.9 (H) 4.0 - 10.5 K/uL   RBC 4.08 (L) 4.22 - 5.81 MIL/uL   Hemoglobin 12.8 (L) 13.0 - 17.0 g/dL   HCT 36.4 (L) 39.0 - 52.0 %   MCV 89.2 78.0 - 100.0 fL   MCH 31.4 26.0 - 34.0 pg   MCHC 35.2 30.0 - 36.0 g/dL   RDW 12.1 11.5 - 15.5 %   Platelets 170 150 - 400 K/uL  Basic metabolic panel     Status: Abnormal   Collection Time: 06/11/15  2:40 AM  Result Value Ref Range   Sodium 138 135 - 145 mmol/L   Potassium 3.6 3.5 - 5.1 mmol/L   Chloride 104 101 - 111 mmol/L   CO2 25 22 - 32 mmol/L   Glucose, Bld 117 (H) 65 - 99 mg/dL   BUN 7 6 - 20 mg/dL   Creatinine, Ser 0.84 0.61 - 1.24 mg/dL   Calcium 8.2 (L) 8.9 - 10.3 mg/dL   GFR calc non Af Amer >60 >60 mL/min   GFR calc Af Amer >60 >60 mL/min    Comment: (NOTE) The eGFR has been calculated using the CKD EPI equation. This calculation has not been validated in all clinical situations. eGFR's persistently <60 mL/min signify possible Chronic Kidney Disease.    Anion gap 9 5 - 15  Magnesium     Status: None   Collection Time: 06/11/15  2:40 AM  Result Value Ref Range   Magnesium 1.8 1.7 - 2.4 mg/dL  Phosphorus     Status: None   Collection Time: 06/11/15  2:40 AM  Result  Value Ref Range   Phosphorus 2.9 2.5 - 4.6 mg/dL  Procalcitonin     Status: None   Collection Time: 06/11/15  2:40 AM  Result Value Ref Range   Procalcitonin 2.31 ng/mL    Comment:        Interpretation: PCT > 2 ng/mL: Systemic infection (sepsis) is likely, unless other causes are known. (NOTE)         ICU PCT Algorithm               Non ICU PCT Algorithm    ----------------------------     ------------------------------         PCT < 0.25 ng/mL                 PCT < 0.1 ng/mL     Stopping of antibiotics            Stopping of antibiotics       strongly encouraged.               strongly encouraged.    ----------------------------     ------------------------------       PCT level decrease by               PCT < 0.25 ng/mL       >= 80% from peak PCT       OR PCT 0.25 - 0.5 ng/mL          Stopping of antibiotics  encouraged.     Stopping of antibiotics           encouraged.    ----------------------------     ------------------------------       PCT level decrease by              PCT >= 0.25 ng/mL       < 80% from peak PCT        AND PCT >= 0.5 ng/mL            Continuing antibiotics                                               encouraged.       Continuing antibiotics            encouraged.    ----------------------------     ------------------------------     PCT level increase compared          PCT > 0.5 ng/mL         with peak PCT AND          PCT >= 0.5 ng/mL             Escalation of antibiotics                                          strongly encouraged.      Escalation of antibiotics        strongly encouraged.   Glucose, capillary     Status: Abnormal   Collection Time: 06/11/15  8:00 AM  Result Value Ref Range   Glucose-Capillary 127 (H) 65 - 99 mg/dL  Glucose, capillary     Status: Abnormal   Collection Time: 06/11/15 11:07 AM  Result Value Ref Range   Glucose-Capillary 113 (H) 65 - 99 mg/dL  Triglycerides      Status: None   Collection Time: 06/12/15 12:56 AM  Result Value Ref Range   Triglycerides 84 <150 mg/dL  Procalcitonin     Status: None   Collection Time: 06/12/15 12:56 AM  Result Value Ref Range   Procalcitonin 2.49 ng/mL    Comment:        Interpretation: PCT > 2 ng/mL: Systemic infection (sepsis) is likely, unless other causes are known. (NOTE)         ICU PCT Algorithm               Non ICU PCT Algorithm    ----------------------------     ------------------------------         PCT < 0.25 ng/mL                 PCT < 0.1 ng/mL     Stopping of antibiotics            Stopping of antibiotics       strongly encouraged.               strongly encouraged.    ----------------------------     ------------------------------       PCT level decrease by               PCT < 0.25 ng/mL       >= 80% from peak PCT       OR PCT  0.25 - 0.5 ng/mL          Stopping of antibiotics                                             encouraged.     Stopping of antibiotics           encouraged.    ----------------------------     ------------------------------       PCT level decrease by              PCT >= 0.25 ng/mL       < 80% from peak PCT        AND PCT >= 0.5 ng/mL            Continuing antibiotics                                               encouraged.       Continuing antibiotics            encouraged.    ----------------------------     ------------------------------     PCT level increase compared          PCT > 0.5 ng/mL         with peak PCT AND          PCT >= 0.5 ng/mL             Escalation of antibiotics                                          strongly encouraged.      Escalation of antibiotics        strongly encouraged.   CBC     Status: Abnormal   Collection Time: 06/12/15 12:56 AM  Result Value Ref Range   WBC 14.0 (H) 4.0 - 10.5 K/uL   RBC 3.90 (L) 4.22 - 5.81 MIL/uL   Hemoglobin 12.2 (L) 13.0 - 17.0 g/dL   HCT 34.6 (L) 39.0 - 52.0 %   MCV 88.7 78.0 - 100.0 fL   MCH  31.3 26.0 - 34.0 pg   MCHC 35.3 30.0 - 36.0 g/dL   RDW 12.2 11.5 - 15.5 %   Platelets 195 150 - 400 K/uL  Basic metabolic panel     Status: Abnormal   Collection Time: 06/12/15 12:56 AM  Result Value Ref Range   Sodium 136 135 - 145 mmol/L   Potassium 3.6 3.5 - 5.1 mmol/L   Chloride 104 101 - 111 mmol/L   CO2 25 22 - 32 mmol/L   Glucose, Bld 119 (H) 65 - 99 mg/dL   BUN 5 (L) 6 - 20 mg/dL   Creatinine, Ser 0.79 0.61 - 1.24 mg/dL   Calcium 7.9 (L) 8.9 - 10.3 mg/dL   GFR calc non Af Amer >60 >60 mL/min   GFR calc Af Amer >60 >60 mL/min    Comment: (NOTE) The eGFR has been calculated using the CKD EPI equation. This calculation has not been validated in all clinical situations. eGFR's persistently <60 mL/min signify possible Chronic Kidney Disease.    Anion gap 7 5 - 15  Magnesium     Status: None   Collection Time: 06/12/15 12:56 AM  Result Value Ref Range   Magnesium 2.0 1.7 - 2.4 mg/dL  Phosphorus     Status: Abnormal   Collection Time: 06/12/15 12:56 AM  Result Value Ref Range   Phosphorus 2.2 (L) 2.5 - 4.6 mg/dL  Glucose, capillary     Status: None   Collection Time: 06/12/15  7:47 AM  Result Value Ref Range   Glucose-Capillary 78 65 - 99 mg/dL    Current Facility-Administered Medications  Medication Dose Route Frequency Provider Last Rate Last Dose  . 0.9 %  sodium chloride infusion   Intravenous Continuous Vivi Barrack, MD 20 mL/hr at 06/11/15 2000    . 0.9 %  sodium chloride infusion  250 mL Intravenous PRN Rahul P Desai, PA-C      . acetaminophen (TYLENOL) solution 650 mg  650 mg Per Tube Q6H PRN Anders Simmonds, MD   650 mg at 06/11/15 2310  . carvedilol (COREG) tablet 3.125 mg  3.125 mg Oral BID WC Almyra Deforest, PA   3.125 mg at 06/12/15 0900  . clonazePAM (KLONOPIN) tablet 1 mg  1 mg Oral TID PRN Vivi Barrack, MD      . dexmedetomidine (PRECEDEX) 400 MCG/100ML (4 mcg/mL) infusion  0.4-1.2 mcg/kg/hr Intravenous Titrated Brand Males, MD 12.9 mL/hr at 06/12/15  0315 0.8 mcg/kg/hr at 06/12/15 0315  . fentaNYL (SUBLIMAZE) injection 100 mcg  100 mcg Intravenous Q15 min PRN Rahul P Desai, PA-C      . fentaNYL (SUBLIMAZE) injection 100 mcg  100 mcg Intravenous Q2H PRN Rahul P Desai, PA-C   100 mcg at 06/11/15 0117  . heparin injection 5,000 Units  5,000 Units Subcutaneous 3 times per day Rahul P Desai, PA-C   5,000 Units at 06/12/15 0640  . midazolam (VERSED) injection 2 mg  2 mg Intravenous Q2H PRN Brand Males, MD   2 mg at 06/11/15 0117  . pantoprazole (PROTONIX) injection 40 mg  40 mg Intravenous QHS Rahul P Desai, PA-C   40 mg at 06/11/15 2145  . potassium phosphate 10 mmol in dextrose 5 % 250 mL infusion  10 mmol Intravenous Once Vivi Barrack, MD      . propofol (DIPRIVAN) 1000 MG/100ML infusion  0-50 mcg/kg/min Intravenous Continuous Rahul P Desai, PA-C   Stopped at 06/11/15 0810  . thiamine (B-1) injection 100 mg  100 mg Intravenous Daily Rahul P Desai, PA-C   100 mg at 06/11/15 0950  . traZODone (DESYREL) tablet 50 mg  50 mg Oral QHS Vivi Barrack, MD      . vancomycin (VANCOCIN) IVPB 1000 mg/200 mL premix  1,000 mg Intravenous Q8H Darnell Level Mancheril, RPH   1,000 mg at 06/12/15 1023    Musculoskeletal: Strength & Muscle Tone: within normal limits Gait & Station: unable to stand Patient leans: N/A  Psychiatric Specialty Exam: ROS  No Fever-chills, No Headache, No changes with Vision or hearing, reports vertigo No problems swallowing food or Liquids, No Chest pain, Cough or Shortness of Breath, No Abdominal pain, No Nausea or Vommitting, Bowel movements are regular, No Blood in stool or Urine, No dysuria, No new skin rashes or bruises, No new joints pains-aches,  No new weakness, tingling, numbness in any extremity, No recent weight gain or loss, No polyuria, polydypsia or polyphagia,  A full 10 point Review of Systems was done, except as stated above, all other Review of Systems were negative.  Blood  pressure 132/71, pulse 86,  temperature 99.3 F (37.4 C), temperature source Core (Comment), resp. rate 21, height '5\' 10"'  (1.778 m), weight 65.2 kg (143 lb 11.8 oz), SpO2 97 %.Body mass index is 20.62 kg/(m^2).  General Appearance: Guarded  Eye Contact::  Minimal  Speech:  Clear and Coherent and Slow  Volume:  Decreased  Mood:  Anxious  Affect:  Depressed and Restricted  Thought Process:  Intact  Orientation:  Full (Time, Place, and Person)  Thought Content:  WDL  Suicidal Thoughts:  Yes.  with intent/plan  Homicidal Thoughts:  No  Memory:  Immediate;   Good Recent;   Good  Judgement:  Impaired  Insight:  Lacking  Psychomotor Activity:  Decreased  Concentration:  Fair  Recall:  [place] of Knowledge:Fair  Language: Good  Akathisia:  Negative  Handed:  Right  AIMS (if indicated):     Assets:  Communication Skills Desire for Improvement Financial Resources/Insurance Housing Leisure Time Resilience Social Support  ADL's:  Intact  Cognition: WNL  Sleep:      Treatment Plan Summary: Patient presented with unknown stresses, intentional overdose of benzodiazepines, Tylenol PM with intent to end his life. Patient is highly guarded and has poor eye contact during my evaluation. Patient is not a reliable historian because he does not trust this provider. We will ask second opinion from another psychiatry provider during this weekend.   Daily contact with patient to assess and evaluate symptoms and progress in treatment and Medication management  Patient will be moving to the regular floor sometime this afternoon We'll continue safety sitter as patient cannot contract for safety Patient was placed involuntary commitment by the physician when he is threatening to leave the hospital yesterday after extubated Patient refuses psychiatric medication management at this time  Disposition: Recommend psychiatric Inpatient admission when medically cleared. Supportive therapy provided about ongoing  stressors.  Stephania Macfarlane,JANARDHAHA R. 06/12/2015 10:37 AM

## 2015-06-12 NOTE — Progress Notes (Signed)
ANTIBIOTIC CONSULT NOTE Pharmacy Consult for Vancomycin Indication: Staph aureus Pneumonia  Allergies  Allergen Reactions  . Sulfa Antibiotics Other (See Comments)    Unknown, childhood reaction   Labs:  Recent Labs  06/10/15 0215 06/11/15 0240 06/12/15 0056  WBC 8.1 15.9* 14.0*  HGB 12.0* 12.8* 12.2*  PLT 189 170 195  CREATININE 0.69 0.84 0.79   Estimated Creatinine Clearance: 137 mL/min (by C-G formula based on Cr of 0.79).  Recent Labs  06/12/15 1506  VANCOTROUGH 13     Assessment: 18 YOM admitted after drug overdose, now extubated with staph aureus in respiratory culture (sensitvities pending) Vancomycin trough slightly below goal of 15, but stable and continues on Q 8 regimen  Goal of Therapy:  Vancomycin trough level 15-20 mcg/ml  Plan:  Continue Vancomycin 1 gram iv Q 8 hours (if MRSA results, consider increasing dose) Continue to follow culture, Scr, fever curve, progress  Thank you Okey RegalLisa Javaya [place], PharmD [phone]

## 2015-06-13 ENCOUNTER — Inpatient Hospital Stay (HOSPITAL_COMMUNITY): Payer: Medicaid Other

## 2015-06-13 DIAGNOSIS — I429 Cardiomyopathy, unspecified: Secondary | ICD-10-CM

## 2015-06-13 DIAGNOSIS — J189 Pneumonia, unspecified organism: Secondary | ICD-10-CM

## 2015-06-13 DIAGNOSIS — E876 Hypokalemia: Secondary | ICD-10-CM

## 2015-06-13 LAB — CULTURE, RESPIRATORY W GRAM STAIN

## 2015-06-13 LAB — BASIC METABOLIC PANEL
ANION GAP: 11 (ref 5–15)
BUN: <5 mg/dL — ABNORMAL LOW (ref 6–20)
CALCIUM: 8.8 mg/dL — AB (ref 8.9–10.3)
CO2: 27 mmol/L (ref 22–32)
CREATININE: 0.77 mg/dL (ref 0.61–1.24)
Chloride: 104 mmol/L (ref 101–111)
GLUCOSE: 157 mg/dL — AB (ref 65–99)
Potassium: 3.3 mmol/L — ABNORMAL LOW (ref 3.5–5.1)
Sodium: 142 mmol/L (ref 135–145)

## 2015-06-13 LAB — CBC
HCT: 37.1 % — ABNORMAL LOW (ref 39.0–52.0)
Hemoglobin: 12.7 g/dL — ABNORMAL LOW (ref 13.0–17.0)
MCH: 30.8 pg (ref 26.0–34.0)
MCHC: 34.2 g/dL (ref 30.0–36.0)
MCV: 89.8 fL (ref 78.0–100.0)
PLATELETS: 250 10*3/uL (ref 150–400)
RBC: 4.13 MIL/uL — ABNORMAL LOW (ref 4.22–5.81)
RDW: 12.2 % (ref 11.5–15.5)
WBC: 8 10*3/uL (ref 4.0–10.5)

## 2015-06-13 LAB — CULTURE, RESPIRATORY: SPECIAL REQUESTS: NORMAL

## 2015-06-13 LAB — GLUCOSE, CAPILLARY
GLUCOSE-CAPILLARY: 151 mg/dL — AB (ref 65–99)
GLUCOSE-CAPILLARY: 92 mg/dL (ref 65–99)
GLUCOSE-CAPILLARY: 78 mg/dL (ref 65–99)
GLUCOSE-CAPILLARY: 115 mg/dL — AB (ref 65–99)
Glucose-Capillary: 101 mg/dL — ABNORMAL HIGH (ref 65–99)
Glucose-Capillary: 66 mg/dL (ref 65–99)
Glucose-Capillary: 87 mg/dL (ref 65–99)

## 2015-06-13 MED ORDER — POTASSIUM CHLORIDE CRYS ER 20 MEQ PO TBCR
40.0000 meq | EXTENDED_RELEASE_TABLET | Freq: Once | ORAL | Status: AC
  Administered 2015-06-13: 40 meq via ORAL
  Filled 2015-06-13: qty 2

## 2015-06-13 MED ORDER — CARVEDILOL 6.25 MG PO TABS
6.2500 mg | ORAL_TABLET | Freq: Two times a day (BID) | ORAL | Status: DC
  Administered 2015-06-13 – 2015-06-14 (×2): 6.25 mg via ORAL
  Filled 2015-06-13 (×2): qty 1

## 2015-06-13 MED ORDER — VALACYCLOVIR HCL 500 MG PO TABS
1000.0000 mg | ORAL_TABLET | Freq: Three times a day (TID) | ORAL | Status: DC
  Administered 2015-06-13 – 2015-06-14 (×3): 1000 mg via ORAL
  Filled 2015-06-13 (×3): qty 2

## 2015-06-13 MED ORDER — LISINOPRIL 5 MG PO TABS
2.5000 mg | ORAL_TABLET | Freq: Every day | ORAL | Status: DC
  Administered 2015-06-13: 2.5 mg via ORAL
  Filled 2015-06-13: qty 1

## 2015-06-13 MED ORDER — CEFAZOLIN SODIUM 1-5 GM-% IV SOLN
1.0000 g | Freq: Three times a day (TID) | INTRAVENOUS | Status: DC
  Administered 2015-06-13 – 2015-06-14 (×3): 1 g via INTRAVENOUS
  Filled 2015-06-13 (×5): qty 50

## 2015-06-13 NOTE — Progress Notes (Signed)
Nutrition Follow-up  DOCUMENTATION CODES:   Not applicable  INTERVENTION:   -Continue with regular diet -Monitor PO intake and supplement diet as appropriate  NUTRITION DIAGNOSIS:   Inadequate oral intake related to inability to eat as evidenced by NPO status.  Resolved  GOAL:   Patient will meet greater than or equal to 90% of their needs  Met  MONITOR:   PO intake, Labs, Weight trends, Skin, I & O's  REASON FOR ASSESSMENT:   Ventilator, Rounds    ASSESSMENT:   19 y.o. M dropped off outside of hospital night of 06/08/15. In ED, had GCS of 3 therefore intubated for airway protection. PCCM called for admission of suspected overdose. Of note, he was seen in [facility] ED 06/01/15 for suicidal ideation.  Pt was extubated on 06/11/15. Pt is on a regular diet; consuming 75-100% of meals per doc flowsheets.   S/p EEG on 06/13/15 due to encephalopathy; reading normal per MD notes.  Psych and CSW following. Pt IVC'd; will require inpatient psych admission once medically stable.   Labs reviewed.   Diet Order:  Diet regular Room service appropriate?: Yes; Fluid consistency:: Thin  Skin:  Reviewed, no issues  Last BM:  06/12/15  Height:   Ht Readings from Last 1 Encounters:  06/09/15 '5\' 10"'  (1.778 m) (56 %*, Z = 0.14)   * Growth percentiles are based on CDC 2-20 Years data.    Weight:   Wt Readings from Last 1 Encounters:  06/13/15 143 lb 8.3 oz (65.1 kg) (31 %*, Z = -0.50)   * Growth percentiles are based on CDC 2-20 Years data.    Ideal Body Weight:  75.5 kg  BMI:  Body mass index is 20.59 kg/(m^2).  Estimated Nutritional Needs:   Kcal:  1600-1800  Protein:  70-80 grams  Fluid:  1.6-1.8 L  EDUCATION NEEDS:   No education needs identified at this time  Raesha Coonrod A. Jimmye Norman, RD, LDN, CDE Pager: [phone] After hours Pager: [phone]

## 2015-06-13 NOTE — Progress Notes (Signed)
EEG completed, results pending. 

## 2015-06-13 NOTE — Procedures (Signed)
ELECTROENCEPHALOGRAM REPORT  Date of Study: 06/13/2015  Patient's Name: Johnny Fuller MRN: [id number] Date of Birth: 02-19-1996  Referring Provider: Dr. Thad Rangeripudeep Rai  Clinical History: This is a 19 year old man [facility] unresponsive requiring intubation. He has been extubated since then. EEG ordered for encephalopathy.  Medications: acetaminophen (TYLENOL) solution 650 mg carvedilol (COREG) tablet 6.25 mg ceFAZolin (ANCEF) IVPB 1 g/50 mL premix clonazePAM (KLONOPIN) tablet 1 mg lisinopril (PRINIVIL,ZESTRIL) tablet 2.5 mg midazolam (VERSED) injection 2 mg thiamine (VITAMIN B-1) tablet 100 mg traZODone (DESYREL) tablet 50 mg  Technical Summary: A multichannel digital EEG recording measured by the international 10-20 system with electrodes applied with paste and impedances below 5000 ohms performed in our laboratory with EKG monitoring in an awake and drowsy patient.  Hyperventilation was not performed. Photic stimulation was performed.  The digital EEG was referentially recorded, reformatted, and digitally filtered in a variety of bipolar and referential montages for optimal display.    Description: The patient is awake and drowsy during the recording.  During maximal wakefulness, there is a symmetric, medium voltage 10 Hz posterior dominant rhythm that attenuates with eye opening.  The record is symmetric.  During drowsiness, there is an increase in theta slowing of the background. Deeper stages of sleep were not seen.  Photic stimulation did not elicit any abnormalities.  There were no epileptiform discharges or electrographic seizures seen.    EKG lead was unremarkable.  Impression: This awake and drowsy EEG is normal.    Clinical Correlation: A normal EEG does not exclude a clinical diagnosis of epilepsy. Clinical correlation is advised.   Patrcia DollyKaren Aquino, M.D.

## 2015-06-13 NOTE — Progress Notes (Signed)
Patient Name: Johnny Fuller Date of Encounter: 06/13/2015  Principal Problem:   Intentional benzodiazepine overdose (HCC) Active Problems:   Acute respiratory failure (HCC)   Encephalopathy   HCAP (healthcare-associated pneumonia)   Primary Cardiologist: Dr Patty SermonsBrackbill  Patient Profile: 19 yo male w/ no prev cardiac issues, admitted 10/17 w/ intentional OD, extubated 10/19, RLL PNA on CXR, cards following for LVD, EF 35-40% by echo. [facility], timing unclear.  SUBJECTIVE: No chest pain, no SOB.   OBJECTIVE Filed Vitals:   06/12/15 1501 06/12/15 2111 06/13/15 0500 06/13/15 0519  BP: 130/62 148/72  141/68  Pulse: 94 97  91  Temp: 98.4 F (36.9 C) 98.7 F (37.1 C)  99.6 F (37.6 C)  TempSrc: Oral Oral  Oral  Resp: 20 20  16   Height:      Weight:   143 lb 8.3 oz (65.1 kg)   SpO2: 97% 98%  97%    Intake/Output Summary (Last 24 hours) at 06/13/15 0759 Last data filed at 06/12/15 1932  Gross per 24 hour  Intake 876.32 ml  Output    775 ml  Net 101.32 ml   Filed Weights   06/11/15 0500 06/12/15 0500 06/13/15 0500  Weight: 144 lb 2.9 oz (65.4 kg) 143 lb 11.8 oz (65.2 kg) 143 lb 8.3 oz (65.1 kg)    PHYSICAL EXAM General: Well developed, well nourished, male in no acute distress. Head: Normocephalic, atraumatic.  Neck: Supple without bruits, JVD not elevated. Lungs:  Resp regular and unlabored, CTA. Heart: RRR, S1, S2, no S3, S4, or murmur; no rub. Abdomen: Soft, non-tender, non-distended, BS + x 4.  Extremities: No clubbing, cyanosis, edema.  Neuro: Alert and oriented X 3. Moves all extremities spontaneously. Psych: Normal affect.  LABS: CBC: Recent Labs  06/12/15 0056 06/13/15 0525  WBC 14.0* 8.0  HGB 12.2* 12.7*  HCT 34.6* 37.1*  MCV 88.7 89.8  PLT 195 250   Basic Metabolic Panel: Recent Labs  06/11/15 0240 06/12/15 0056 06/13/15 0525  NA 138 136 142  K 3.6 3.6 3.3*  CL 104 104 104  CO2 25 25 27   GLUCOSE 117* 119* 157*  BUN  7 5* <5*  CREATININE 0.84 0.79 0.77  CALCIUM 8.2* 7.9* 8.8*  MG 1.8 2.0  --   PHOS 2.9 2.2*  --    Fasting Lipid Panel: Recent Labs  06/12/15 0056  TRIG 84    TELE: SR, ST      Radiology/Studies: Mr Brain Wo Contrast  06/12/2015  CLINICAL DATA:  Encephalopathy. The patient is intubated and tail yesterday. EXAM: MRI HEAD WITHOUT CONTRAST TECHNIQUE: Multiplanar, multiecho pulse sequences of the brain and surrounding structures were obtained without intravenous contrast. COMPARISON:  CT of the head without contrast 06/08/2015. FINDINGS: No acute infarct, hemorrhage, or mass lesion is present. The ventricles are of normal size. No significant extraaxial fluid collection is present. Brain does appear mildly atrophic. No significant white matter disease is present. The internal auditory canals are within normal limits. The brainstem is normal. Flow is present in the major intracranial arteries. Globes and orbits are intact. The paranasal sinuses and mastoid air cells are clear. The skullbase is normal.  Midline structures are unremarkable. IMPRESSION: 1. The brain appears mildly atrophic. This can be seen in the setting of sustained parental nutrition. 2. No significant white matter disease or focal abnormality to explain encephalopathy. The brain is otherwise normal. Electronically Signed   By: Virl Sonhristopher  Mattern M.D.  On: 06/12/2015 17:32     Current Medications:  . carvedilol  3.125 mg Oral BID WC  . heparin  5,000 Units Subcutaneous 3 times per day  . potassium chloride  40 mEq Oral Once  . thiamine  100 mg Oral Daily  . traZODone  50 mg Oral QHS  . vancomycin  1,000 mg Intravenous Q8H   . sodium chloride 20 mL/hr at 06/11/15 2000  . dexmedetomidine 0.2 mcg/kg/hr (06/12/15 1250)    ASSESSMENT AND PLAN: 1. AMS 2/2 drug overdose requiring intubation - extubated on 10/19.   2. 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 On 3.125mg  coreg and for limited echo 10/26. If EF   improved, no further workup  3. Suicidal attempt - OD confirmed by family to be intentional, seen by psychiatry  4. RLL PNA:   - abx per IM  5. Elevated trop:   - likely demand ischemia related to tachycardia, AMS, acute resp failure, PNA and illicit drug   overdose  6. Hypokalemia:   - supplement ordered, recheck in am.  - he is now on solid food, was on clear liquids.   - unsure if there is underlying cause for low K+, further eval per MD  Plan: Continue low dose carvedilol, will see again next week for echo  Principal Problem:   Intentional benzodiazepine overdose (HCC) Active Problems:   Acute respiratory failure (HCC)   Encephalopathy   HCAP (healthcare-associated pneumonia)   Signed, Barrett, Rhonda , PA-C 7:59 AM 06/13/2015  Agree with above assessment and plan. Heart rate high normal. Will increase carvedilol to 6.25 mg BID. Add low dose lisinopril.

## 2015-06-13 NOTE — Progress Notes (Signed)
Spoke with Johnny Fuller, SW who will be following pt over the weekend also, per Psychiatry note another physician will see pt over the weekend for second opinion. Pt informed as he had inquired as to when another psych md to see him.

## 2015-06-13 NOTE — Progress Notes (Signed)
[facility]                                                                              Patient Demographics  Johnny Fuller, is a 19 y.o. male, DOB - 04/05/1996, RUE:[id number]  Admit date - 06/08/2015   Admitting Physician Leslye Peer, MD  Outpatient Primary MD for the patient is No primary care provider on file.  LOS - 4   No chief complaint on file.      Brief HPI   Patient was admitted by critical care service on 06/09/15 per admit note .  Pt is encephalopathic; therefore, this HPI is obtained from chart review. Johnny Fuller is a 19 y.o. M with PMH of Anemia, Depression, Anxiety, Genital Herpes. He was reportedly dropped off outside of [facility] on night of 06/08/15. Hospital security was called and informed that pt was dropped off and was unresponsive. Pt was brought to ED where he remained unresponsive with GCS of 3. He was given a round of narcan (? Dose) which reportedly had no effect. He was subsequently intubated for airway protection. UDS was positive for amphetamines and benzo's. CT of the head negative for acute process. K low at 2.8. Otherwise, rest of labs unremarkable. Of note, he was seen in [facility] ED 06/01/15 for suicidal ideation. At that time, he had used crystal meth, trazodone, and alcohol the day prior and informed EDP that he had suicidal ideation (with no actual attempt) and was thinking about hanging himself. He was admitted to [facility] for substance or medication-induced psychotic disorder (HCC) , with psychosis and crisis management. He was deemed stable and discharged on 06/02/15 with plans for outpatient follow up with psychiatry.  Patient transferred out of the ICU by CCM on 10/20, hospitalist service assumed care on 10/21   Assessment & Plan    Principal Problem:   Intentional benzodiazepine overdose (HCC)/suicide attempt - Currently stable, psychiatry consulted, patient placed on Klonopin, trazodone - Psychiatry  recommendations reviewed, patient is requesting second opinion from a different psychiatrist as noted by Dr Magdalen Spatz.  - He will need inpatient psychiatry admission upon discharge - Continue one-to-one sitter - UDS  showed benzodiazepines and amphetamines  Active Problems:   Acute respiratory failure (HCC) - Patient was intubated at the time of admission, 10/17 for airway protection, was unresponsive at the time of admission. He was extubated on 10/19. - Currently stable, O2 sats 97% on room air, afebrile, no leukocytosis.     Acute Encephalopathy Likely due to #1: Resolved, patient back to baseline mental status - Neurology following, MRI of the brain done 10/20, brain appears mildly atrophic can be seen in the setting of sustained parenteral nutrition otherwise no significant white matter disease or focal abnormality to explain encephalopathy. - Neurology had also recommended EEG, ordered to rule out residual encephalopathy. If normal, no further neurological intervention needed.   Right lung staph aureus pneumonia, possibly aspiration - Continue IV vancomycin. Zosyn was discontinued by CCM on 10/20. - Sputum culture/ tracheal aspirate showed moderate staph aureus, sensitivities pending - Diet advanced today    Secondary cardiomyopathy (HCC)With elevated troponins; unclear etiology,  possibly demand ischemia from tachycardia, overdose - Cardiology following, 2-D echo showed EF of 35-40% was diffuse hypokinesis - Placed on Coreg, cardiology planning echocardiogram next week    Hypokalemia - Replaced  Code Status:Full code   Family Communication: Discussed in detail with the patient, all imaging results, lab results explained to the patient. No family member at the bedside   Disposition Plan: Unclear of the timing of Behavioral Health admission, pending EEG and psychiatrist opinion   Time Spent in minutes   25 minutes  Procedures  CT head MRI of the brain Chest  x-ray Intubation  Consults   Neurology, Dr. Roseanne RenoStewart [facility]  DVT Prophylaxis  heparin  Medications  Scheduled Meds: . carvedilol  3.125 mg Oral BID WC  . heparin  5,000 Units Subcutaneous 3 times per day  . potassium chloride  40 mEq Oral Once  . thiamine  100 mg Oral Daily  . traZODone  50 mg Oral QHS  . vancomycin  1,000 mg Intravenous Q8H   Continuous Infusions: . sodium chloride 20 mL/hr at 06/11/15 2000   PRN Meds:.sodium chloride, acetaminophen (TYLENOL) oral liquid 160 mg/5 mL, clonazePAM, midazolam   Antibiotics   Anti-infectives    Start     Dose/Rate Route Frequency Ordered Stop   06/11/15 0000  vancomycin (VANCOCIN) IVPB 1000 mg/200 mL premix     1,000 mg 200 mL/hr over 60 Minutes Intravenous Every 8 hours 06/10/15 1456     06/10/15 1500  piperacillin-tazobactam (ZOSYN) IVPB 3.375 g  Status:  Discontinued     3.375 g 12.5 mL/hr over 240 Minutes Intravenous Every 8 hours 06/10/15 1446 06/12/15 0949   06/10/15 1500  vancomycin (VANCOCIN) 1,250 mg in sodium chloride 0.9 % 250 mL IVPB     1,250 mg 166.7 mL/hr over 90 Minutes Intravenous  Once 06/10/15 1446 06/10/15 1730        Subjective:   [name] was seen and examined today. Feeling better today, denies any complaints. Hoping to get solid food.  Patient denies dizziness, chest pain, shortness of breath, abdominal pain, N/V/D/C, new weakness, numbess, tingling. No acute events overnight.    Objective:   Blood pressure 137/71, pulse 91, temperature 98.5 F (36.9 C), temperature source Oral, resp. rate 16, height 5\' 10"  (1.778 m), weight 65.1 kg (143 lb 8.3 oz), SpO2 97 %.  Wt Readings from Last 3 Encounters:  06/13/15 65.1 kg (143 lb 8.3 oz) (31 %*, Z = -0.50)   * Growth percentiles are based on CDC 2-20 Years data.     Intake/Output Summary (Last 24 hours) at 06/13/15 0843 Last data filed at 06/12/15 1932  Gross per 24 hour  Intake 840.92 ml  Output     775 ml  Net  65.92 ml    Exam  General: Alert and oriented x 3, NAD  HEENT:  PERRLA, EOMI, Anicteric Sclera, mucous membranes moist.   Neck: Supple, no JVD, no masses  CVS: S1 S2 auscultated, no rubs, murmurs or gallops. Regular rate and rhythm.  Respiratory: Clear to auscultation bilaterally, no wheezing, rales or rhonchi  Abdomen: Soft, nontender, nondistended, + bowel sounds  Ext: no cyanosis clubbing or edema  Neuro: AAOx3, Cr N's II- XII. Strength 5/5 upper and lower extremities bilaterally  Skin: No rashes  Psych: Normal affect and demeanor, alert and oriented x3    Data Review   Micro Results Recent Results (from the past 240 hour(s))  MRSA PCR Screening  Status: None   Collection Time: 06/09/15  2:07 AM  Result Value Ref Range Status   MRSA by PCR NEGATIVE NEGATIVE Final    Comment:        The GeneXpert MRSA Assay (FDA approved for NASAL specimens only), is one component of a comprehensive MRSA colonization surveillance program. It is not intended to diagnose MRSA infection nor to guide or monitor treatment for MRSA infections.   Culture, Urine     Status: None   Collection Time: 06/10/15  3:29 PM  Result Value Ref Range Status   Specimen Description URINE, CATHETERIZED  Final   Special Requests NONE  Final   Culture NO GROWTH 1 DAY  Final   Report Status 06/11/2015 FINAL  Final  Culture, blood (routine x 2)     Status: None (Preliminary result)   Collection Time: 06/10/15  3:31 PM  Result Value Ref Range Status   Specimen Description BLOOD RIGHT HAND  Final   Special Requests BOTTLES DRAWN AEROBIC ONLY 5CC  Final   Culture NO GROWTH 2 DAYS  Final   Report Status PENDING  Incomplete  Culture, blood (routine x 2)     Status: None (Preliminary result)   Collection Time: 06/10/15  3:37 PM  Result Value Ref Range Status   Specimen Description BLOOD RIGHT ARM  Final   Special Requests IN PEDIATRIC BOTTLE  1CC  Final   Culture NO GROWTH 2 DAYS   Final   Report Status PENDING  Incomplete  Culture, respiratory (NON-Expectorated)     Status: None (Preliminary result)   Collection Time: 06/10/15  5:11 PM  Result Value Ref Range Status   Specimen Description TRACHEAL ASPIRATE  Final   Special Requests Normal  Final   Gram Stain   Final    ABUNDANT WBC PRESENT, PREDOMINANTLY PMN NO SQUAMOUS EPITHELIAL CELLS SEEN MODERATE GRAM POSITIVE COCCI IN PAIRS IN CLUSTERS Performed at [facility]    Culture   Final    MODERATE STAPHYLOCOCCUS AUREUS Note: RIFAMPIN AND GENTAMICIN SHOULD NOT BE USED AS SINGLE DRUGS FOR TREATMENT OF STAPH INFECTIONS. Performed at [facility]    Report Status PENDING  Incomplete    Radiology Reports Ct Head Wo Contrast  06/09/2015  CLINICAL DATA:  Dropped off at hospital, unresponsive. Initial encounter. EXAM: CT HEAD WITHOUT CONTRAST TECHNIQUE: Contiguous axial images were obtained from the base of the skull through the vertex without intravenous contrast. COMPARISON:  None. FINDINGS: There is no evidence of acute infarction, mass lesion, or intra- or extra-axial hemorrhage on CT. The posterior fossa, including the cerebellum, brainstem and fourth ventricle, is within normal limits. The third and lateral ventricles, and basal ganglia are unremarkable in appearance. The cerebral hemispheres are symmetric in appearance, with normal gray-white differentiation. No mass effect or midline shift is seen. There is no evidence of fracture; visualized osseous structures are unremarkable in appearance. The visualized portions of the orbits are within normal limits. The paranasal sinuses and mastoid air cells are well-aerated. No significant soft tissue abnormalities are seen. IMPRESSION: Unremarkable noncontrast CT of the head. Electronically Signed   By: Roanna Raider M.D.   On: 06/09/2015 00:23   Mr Brain Wo Contrast  06/12/2015  CLINICAL DATA:  Encephalopathy. The patient is intubated and tail yesterday.  EXAM: MRI HEAD WITHOUT CONTRAST TECHNIQUE: Multiplanar, multiecho pulse sequences of the brain and surrounding structures were obtained without intravenous contrast. COMPARISON:  CT of the head without contrast 06/08/2015. FINDINGS: No acute infarct, hemorrhage, or  mass lesion is present. The ventricles are of normal size. No significant extraaxial fluid collection is present. Brain does appear mildly atrophic. No significant white matter disease is present. The internal auditory canals are within normal limits. The brainstem is normal. Flow is present in the major intracranial arteries. Globes and orbits are intact. The paranasal sinuses and mastoid air cells are clear. The skullbase is normal.  Midline structures are unremarkable. IMPRESSION: 1. The brain appears mildly atrophic. This can be seen in the setting of sustained parental nutrition. 2. No significant white matter disease or focal abnormality to explain encephalopathy. The brain is otherwise normal. Electronically Signed   By: Marin Roberts M.D.   On: 06/12/2015 17:32   Dg Chest Port 1 View  06/11/2015  CLINICAL DATA:  Acute respiratory failure.  Altered mental status. EXAM: PORTABLE CHEST 1 VIEW COMPARISON:  Yesterday FINDINGS: Endotracheal tube tip between clavicular heads and carina. Orogastric tube reaches the stomach, which is gas distended. New airspace disease at the right base. Given history, question aspiration pneumonia. Normal heart size. No air leak or effusion. IMPRESSION: 1. New right basilar consolidation, likely pneumonia. Given the presentation this may reflect sequela of aspiration. 2. Endotracheal and orogastric tubes remain in good position. 3. The stomach is gas distended. Electronically Signed   By: Marnee Spring M.D.   On: 06/11/2015 07:19   Dg Chest Port 1 View  06/10/2015  CLINICAL DATA:  Respiratory failure. EXAM: PORTABLE CHEST 1 VIEW COMPARISON:  06/08/2015. FINDINGS: Endotracheal tube and NG tube in stable  position. Heart size stable. Mild bibasilar subsegmental atelectasis. No pleural effusion or pneumothorax. IMPRESSION: 1. Lines and tubes in stable position. 2. Mild bibasilar subsegmental atelectasis. Electronically Signed   By: Maisie Fus  Register   On: 06/10/2015 07:17   Dg Chest Portable 1 View  06/09/2015  CLINICAL DATA:  Endotracheal tube placement.  Found unresponsive. EXAM: PORTABLE CHEST 1 VIEW COMPARISON:  None. FINDINGS: The endotracheal tube is 3.3 cm from the carina. Lung volumes are low. The cardiomediastinal contours are normal. Pulmonary vasculature is normal. No consolidation, pleural effusion, or pneumothorax. No acute osseous abnormalities are seen. IMPRESSION: 1. Endotracheal tube 3.3 cm from the carina. 2. Hypo aerated but clear lungs. Electronically Signed   By: Rubye Oaks M.D.   On: 06/09/2015 00:01   Dg Abd Portable 1v  06/09/2015  CLINICAL DATA:  Enteric tube placement.  Initial encounter. EXAM: PORTABLE ABDOMEN - 1 VIEW COMPARISON:  None. FINDINGS: The patient's enteric tube is seen ending overlying the body of the stomach. The visualized bowel gas pattern is unremarkable. Scattered air and stool filled loops of colon are seen; no abnormal dilatation of small bowel loops is seen to suggest small bowel obstruction. No free intra-abdominal air is identified, though evaluation for free air is limited on a single supine view. The visualized osseous structures are within normal limits; the sacroiliac joints are unremarkable in appearance. The visualized lung bases are essentially clear. IMPRESSION: Enteric tube seen ending overlying the body of the stomach. Electronically Signed   By: Roanna Raider M.D.   On: 06/09/2015 04:08    CBC  Recent Labs Lab 06/08/15 2231 06/09/15 0236 06/10/15 0215 06/11/15 0240 06/12/15 0056 06/13/15 0525  WBC 6.4 6.4 8.1 15.9* 14.0* 8.0  HGB 15.6 13.1 12.0* 12.8* 12.2* 12.7*  HCT 43.2 35.9* 34.2* 36.4* 34.6* 37.1*  PLT 253 242 189 170 195  250  MCV 86.9 86.7 89.1 89.2 88.7 89.8  MCH 31.4 31.6 31.3 31.4 31.3  30.8  MCHC 36.1* 36.5* 35.1 35.2 35.3 34.2  RDW 11.9 11.8 12.4 12.1 12.2 12.2  LYMPHSABS 1.8  --   --   --   --   --   MONOABS 0.8  --   --   --   --   --   EOSABS 0.2  --   --   --   --   --   BASOSABS 0.0  --   --   --   --   --     Chemistries   Recent Labs Lab 06/08/15 2231 06/09/15 0236 06/09/15 1100 06/10/15 0215 06/11/15 0240 06/12/15 0056 06/13/15 0525  NA 135 138 141 139 138 136 142  K 2.8* 2.7* 3.6 3.3* 3.6 3.6 3.3*  CL 96* 100* 110 109 104 104 104  CO2 GLUCOSE 108* 91 97 115* 117* 119* 157*  BUN 7 5* <5* 6 7 5* <5*  CREATININE 1.00 0.81 0.82 0.69 0.84 0.79 0.77  CALCIUM 9.4 8.7* 8.8* 8.3* 8.2* 7.9* 8.8*  MG  --  2.0  --  1.9 1.8 2.0  --   AST 64*  --   --   --   --   --   --   ALT 49  --   --   --   --   --   --   ALKPHOS 66  --   --   --   --   --   --   BILITOT 1.0  --   --   --   --   --   --    ------------------------------------------------------------------------------------------------------------------ estimated creatinine clearance is 136.8 mL/min (by C-G formula based on Cr of 0.77). ------------------------------------------------------------------------------------------------------------------ No results for input(s): HGBA1C in the last 72 hours. ------------------------------------------------------------------------------------------------------------------  Recent Labs  06/12/15 0056  TRIG 84   ------------------------------------------------------------------------------------------------------------------ No results for input(s): TSH, T4TOTAL, T3FREE, THYROIDAB in the last 72 hours.  Invalid input(s): FREET3 ------------------------------------------------------------------------------------------------------------------ No results for input(s): VITAMINB12, FOLATE, FERRITIN, TIBC, IRON, RETICCTPCT in the last 72 hours.  Coagulation profile No  results for input(s): INR, PROTIME in the last 168 hours.  No results for input(s): DDIMER in the last 72 hours.  Cardiac Enzymes  Recent Labs Lab 06/09/15 0840 06/09/15 1447 06/09/15 2030  TROPONINI 0.56* 0.34* 0.20*   ------------------------------------------------------------------------------------------------------------------ Invalid input(s): POCBNP   Recent Labs  06/12/15 1149 06/12/15 2112 06/12/15 2348 06/13/15 0442 06/13/15 0531 06/13/15 0749  GLUCAP 162* 107* 87 66 151* 101*     Hetty Linhart M.D. [facility] 06/13/2015, 8:43 AM  Pager: [phone] Between 7am to 7pm - call Pager - [phone]  After 7pm go to [email or web address] - password [email or web address]  Call night coverage person covering after 7pm

## 2015-06-13 NOTE — Progress Notes (Signed)
Hypoglycemic Event  CBG: 66  Treatment: 4 oz grape juice  Symptoms: asymptomatic  Follow-up CBG: Time:0500 CBG Result:151  Possible Reasons for Event: Poor PO intake      Johnny Fuller

## 2015-06-14 ENCOUNTER — Encounter (HOSPITAL_COMMUNITY): Payer: Self-pay | Admitting: *Deleted

## 2015-06-14 ENCOUNTER — Inpatient Hospital Stay (HOSPITAL_COMMUNITY)
Admission: AD | Admit: 2015-06-14 | Discharge: 2015-06-23 | DRG: 885 | Disposition: A | Discharge status: Home / Self Care | Payer: Medicaid Other | Source: Intra-hospital | Attending: Psychiatry | Admitting: Psychiatry

## 2015-06-14 DIAGNOSIS — R52 Pain, unspecified: Secondary | ICD-10-CM | POA: Diagnosis present

## 2015-06-14 DIAGNOSIS — F17200 Nicotine dependence, unspecified, uncomplicated: Secondary | ICD-10-CM | POA: Diagnosis present

## 2015-06-14 DIAGNOSIS — R45851 Suicidal ideations: Secondary | ICD-10-CM | POA: Diagnosis present

## 2015-06-14 DIAGNOSIS — F1520 Other stimulant dependence, uncomplicated: Secondary | ICD-10-CM | POA: Diagnosis present

## 2015-06-14 DIAGNOSIS — M79622 Pain in left upper arm: Secondary | ICD-10-CM | POA: Diagnosis not present

## 2015-06-14 DIAGNOSIS — F1510 Other stimulant abuse, uncomplicated: Secondary | ICD-10-CM | POA: Diagnosis not present

## 2015-06-14 DIAGNOSIS — R079 Chest pain, unspecified: Secondary | ICD-10-CM | POA: Diagnosis not present

## 2015-06-14 DIAGNOSIS — F410 Panic disorder [episodic paroxysmal anxiety] without agoraphobia: Secondary | ICD-10-CM | POA: Diagnosis present

## 2015-06-14 DIAGNOSIS — J690 Pneumonitis due to inhalation of food and vomit: Secondary | ICD-10-CM

## 2015-06-14 DIAGNOSIS — G4700 Insomnia, unspecified: Secondary | ICD-10-CM | POA: Diagnosis present

## 2015-06-14 DIAGNOSIS — I429 Cardiomyopathy, unspecified: Secondary | ICD-10-CM | POA: Diagnosis not present

## 2015-06-14 DIAGNOSIS — T424X2A Poisoning by benzodiazepines, intentional self-harm, initial encounter: Secondary | ICD-10-CM | POA: Diagnosis not present

## 2015-06-14 DIAGNOSIS — F332 Major depressive disorder, recurrent severe without psychotic features: Principal | ICD-10-CM | POA: Diagnosis present

## 2015-06-14 DIAGNOSIS — G9340 Encephalopathy, unspecified: Secondary | ICD-10-CM | POA: Diagnosis present

## 2015-06-14 DIAGNOSIS — F1994 Other psychoactive substance use, unspecified with psychoactive substance-induced mood disorder: Secondary | ICD-10-CM | POA: Diagnosis present

## 2015-06-14 DIAGNOSIS — Z818 Family history of other mental and behavioral disorders: Secondary | ICD-10-CM

## 2015-06-14 DIAGNOSIS — I519 Heart disease, unspecified: Secondary | ICD-10-CM | POA: Diagnosis not present

## 2015-06-14 DIAGNOSIS — J9601 Acute respiratory failure with hypoxia: Secondary | ICD-10-CM | POA: Diagnosis not present

## 2015-06-14 DIAGNOSIS — I071 Rheumatic tricuspid insufficiency: Secondary | ICD-10-CM | POA: Diagnosis not present

## 2015-06-14 LAB — BASIC METABOLIC PANEL
ANION GAP: 9 (ref 5–15)
BUN: 9 mg/dL (ref 6–20)
CALCIUM: 9.4 mg/dL (ref 8.9–10.3)
CO2: 30 mmol/L (ref 22–32)
CREATININE: 0.8 mg/dL (ref 0.61–1.24)
Chloride: 102 mmol/L (ref 101–111)
GFR calc non Af Amer: >60 mL/min (ref >60)
Glucose, Bld: 107 mg/dL — ABNORMAL HIGH (ref 65–99)
Potassium: 4.3 mmol/L (ref 3.5–5.1)
SODIUM: 141 mmol/L (ref 135–145)

## 2015-06-14 LAB — CBC
HEMATOCRIT: 40 % (ref 39.0–52.0)
HEMOGLOBIN: 13.8 g/dL (ref 13.0–17.0)
MCH: 31.4 pg (ref 26.0–34.0)
MCHC: 34.5 g/dL (ref 30.0–36.0)
MCV: 90.9 fL (ref 78.0–100.0)
Platelets: 318 10*3/uL (ref 150–400)
RBC: 4.4 MIL/uL (ref 4.22–5.81)
RDW: 12.3 % (ref 11.5–15.5)
WBC: 5.5 10*3/uL (ref 4.0–10.5)

## 2015-06-14 LAB — GLUCOSE, CAPILLARY
GLUCOSE-CAPILLARY: 104 mg/dL — AB (ref 65–99)
GLUCOSE-CAPILLARY: 109 mg/dL — AB (ref 65–99)
GLUCOSE-CAPILLARY: 88 mg/dL (ref 65–99)

## 2015-06-14 MED ORDER — NICOTINE 21 MG/24HR TD PT24
21.0000 mg | MEDICATED_PATCH | Freq: Every day | TRANSDERMAL | Status: DC
  Administered 2015-06-14 – 2015-06-23 (×10): 21 mg via TRANSDERMAL
  Filled 2015-06-14 (×15): qty 1

## 2015-06-14 MED ORDER — DOXYCYCLINE HYCLATE 100 MG PO TABS: 100.0000 mg | ORAL_TABLET | Freq: Two times a day (BID) | ORAL | Status: DC

## 2015-06-14 MED ORDER — LISINOPRIL 5 MG PO TABS
5.0000 mg | ORAL_TABLET | Freq: Every day | ORAL | Status: DC
  Administered 2015-06-14: 5 mg via ORAL
  Filled 2015-06-14: qty 1

## 2015-06-14 MED ORDER — DOXYCYCLINE HYCLATE 100 MG PO TABS
100.0000 mg | ORAL_TABLET | Freq: Two times a day (BID) | ORAL | Status: DC
  Administered 2015-06-14: 100 mg via ORAL

## 2015-06-14 MED ORDER — TRAZODONE HCL 50 MG PO TABS
50.0000 mg | ORAL_TABLET | Freq: Every day | ORAL | Status: DC
  Administered 2015-06-14 – 2015-06-15 (×2): 50 mg via ORAL
  Filled 2015-06-14 (×5): qty 1

## 2015-06-14 MED ORDER — MAGNESIUM HYDROXIDE 400 MG/5ML PO SUSP: 30.0000 mL | Freq: Every day | ORAL | Status: DC | PRN

## 2015-06-14 MED ORDER — CARVEDILOL 6.25 MG PO TABS
6.2500 mg | ORAL_TABLET | Freq: Two times a day (BID) | ORAL | Status: DC
  Administered 2015-06-14 – 2015-06-22 (×16): 6.25 mg via ORAL
  Filled 2015-06-14 (×8): qty 1
  Filled 2015-06-14: qty 2
  Filled 2015-06-14 (×12): qty 1

## 2015-06-14 MED ORDER — PNEUMOCOCCAL VAC POLYVALENT 25 MCG/0.5ML IJ INJ: 0.5000 mL | INJECTION | INTRAMUSCULAR | Status: DC

## 2015-06-14 MED ORDER — CLONAZEPAM 0.5 MG PO TABS
0.5000 mg | ORAL_TABLET | Freq: Two times a day (BID) | ORAL | Status: DC | PRN
  Administered 2015-06-14 – 2015-06-15 (×3): 0.5 mg via ORAL
  Filled 2015-06-14 (×3): qty 1

## 2015-06-14 MED ORDER — NICOTINE 21 MG/24HR TD PT24
MEDICATED_PATCH | TRANSDERMAL | Status: AC
  Filled 2015-06-14: qty 1

## 2015-06-14 MED ORDER — LISINOPRIL 5 MG PO TABS: 5.0000 mg | ORAL_TABLET | Freq: Every day | ORAL | Status: DC

## 2015-06-14 MED ORDER — ALUM & MAG HYDROXIDE-SIMETH 200-200-20 MG/5ML PO SUSP: 30.0000 mL | ORAL | Status: DC | PRN

## 2015-06-14 MED ORDER — CARVEDILOL 6.25 MG PO TABS: 6.2500 mg | ORAL_TABLET | Freq: Two times a day (BID) | ORAL | Status: DC

## 2015-06-14 MED ORDER — NICOTINE 21 MG/24HR TD PT24: 21.0000 mg | MEDICATED_PATCH | Freq: Every day | TRANSDERMAL | Status: DC

## 2015-06-14 MED ORDER — ACETAMINOPHEN 325 MG PO TABS
650.0000 mg | ORAL_TABLET | Freq: Four times a day (QID) | ORAL | Status: DC | PRN
  Administered 2015-06-23: 650 mg via ORAL
  Filled 2015-06-14: qty 2

## 2015-06-14 NOTE — Progress Notes (Signed)
D: Patient in his room in bed on approach.  Patient states he is adjusting to the unit.  Patient states he does not have a goal yet but states he will set one for tomorrow.  Patient states he is ready to stop using drugs.   Patient denies SI/HI and denies AVH.   A: Staff to monitor Q 15 mins for safety.  Encouragement and support offered.  Scheduled medications administered per orders. R: Patient remains safe on the unit.  Patient attended group tonight.  Patient  visible on the unit.  Patient taking administered medications.

## 2015-06-14 NOTE — Progress Notes (Signed)
D) 19 year old Pt coming from a medical unit after overdosing on on benzodiazepines, according to the report. Pt however states that he took an overdoes of 150 tylenol pm's. States, "my life is very hard. I have a lot of problems. Pt admitted to some drug use but was vague and really would not commit to an amount or the type of drug.Staes he does not drink normallyl. States "the semester starts this coming Monday and I would like to be able to go to school. States "I am getting my CMA". Affect is laughing and joking. Not serious. Mood is upbeat. States as his goal "I want to get out of here so that I go and buy myself a large hamburger. Haven't had one in over a week". Pt. Is reluctant to really be honest with his feelings. A) Given support, reassurance and praise. Non-invasive search done; no belongings locked up; oriented to the unit and provided with toiletteties to take a shower. Provided with a 1:1 R) Pt presently denies SI and HI. Asked for his klonopin

## 2015-06-14 NOTE — Progress Notes (Signed)
CSW spoke with Simonne ComeLeo, [facility].  Pt will be placed on c/s list for re-eval by another provider, per Dr. Nilda SimmerJonnalagada's recommendation from 10/20.  CSW will continue to follow for disposition.

## 2015-06-14 NOTE — Progress Notes (Signed)
Patient Name: Johnny Fuller Date of Encounter: 06/14/2015  Principal Problem:   Intentional benzodiazepine overdose (HCC) Active Problems:   Acute respiratory failure (HCC)   Encephalopathy   HCAP (healthcare-associated pneumonia)   Secondary cardiomyopathy (HCC)   Hypokalemia   Primary Cardiologist: Dr Patty SermonsBrackbill  Patient Profile: 19 yo male w/ no prev cardiac issues, admitted 10/17 w/ intentional OD, extubated 10/19, RLL PNA on CXR, cards following for LVD, EF 35-40% by echo. [facility], timing unclear.  SUBJECTIVE: No chest pain, no SOB.   OBJECTIVE Filed Vitals:   06/13/15 0830 06/13/15 1425 06/13/15 2221 06/14/15 0532  BP: 137/71 127/56 136/68 126/60  Pulse:  67 77 70  Temp: 98.5 F (36.9 C) 98.4 F (36.9 C) 98.3 F (36.8 C) 98.3 F (36.8 C)  TempSrc: Oral Oral Oral Oral  Resp:  18 18 18   Height:      Weight:    59.5 kg (131 lb 2.8 oz)  SpO2:  99% 98% 97%    Intake/Output Summary (Last 24 hours) at 06/14/15 0916 Last data filed at 06/14/15 0700  Gross per 24 hour  Intake    120 ml  Output      0 ml  Net    120 ml   Filed Weights   06/12/15 0500 06/13/15 0500 06/14/15 0532  Weight: 65.2 kg (143 lb 11.8 oz) 65.1 kg (143 lb 8.3 oz) 59.5 kg (131 lb 2.8 oz)    PHYSICAL EXAM General: Well developed, well nourished, male in no acute distress. Head: Normocephalic, atraumatic.  Neck: Supple without bruits, JVD not elevated. Lungs:  Resp regular and unlabored, CTA. Heart: RRR, S1, S2, no S3, S4, or murmur; no rub. Abdomen: Soft, non-tender, non-distended, BS + x 4.  Extremities: No clubbing, cyanosis, edema.  Neuro: Alert and oriented X 3. Moves all extremities spontaneously. Psych: Normal affect.  LABS: CBC:  Recent Labs  06/13/15 0525 06/14/15 0448  WBC 8.0 5.5  HGB 12.7* 13.8  HCT 37.1* 40.0  MCV 89.8 90.9  PLT 250 318   Basic Metabolic Panel:  Recent Labs  02/72/5310/20/16 0056 06/13/15 0525 06/14/15 0448  NA 136 142 141  K  3.6 3.3* 4.3  CL 104 104 102  CO2 25 27 30   GLUCOSE 119* 157* 107*  BUN 5* <5* 9  CREATININE 0.79 0.77 0.80  CALCIUM 7.9* 8.8* 9.4  MG 2.0  --   --   PHOS 2.2*  --   --    Fasting Lipid Panel:  Recent Labs  06/12/15 0056  TRIG 84    TELE: SR, ST      Radiology/Studies: Mr Brain Wo Contrast  06/12/2015  CLINICAL DATA:  Encephalopathy. The patient is intubated and tail yesterday. EXAM: MRI HEAD WITHOUT CONTRAST TECHNIQUE: Multiplanar, multiecho pulse sequences of the brain and surrounding structures were obtained without intravenous contrast. COMPARISON:  CT of the head without contrast 06/08/2015. FINDINGS: No acute infarct, hemorrhage, or mass lesion is present. The ventricles are of normal size. No significant extraaxial fluid collection is present. Brain does appear mildly atrophic. No significant white matter disease is present. The internal auditory canals are within normal limits. The brainstem is normal. Flow is present in the major intracranial arteries. Globes and orbits are intact. The paranasal sinuses and mastoid air cells are clear. The skullbase is normal.  Midline structures are unremarkable. IMPRESSION: 1. The brain appears mildly atrophic. This can be seen in the setting of sustained parental nutrition.  2. No significant white matter disease or focal abnormality to explain encephalopathy. The brain is otherwise normal. Electronically Signed   By: Marin Roberts M.D.   On: 06/12/2015 17:32     Current Medications:  . carvedilol  6.25 mg Oral BID WC  .  ceFAZolin (ANCEF) IV  1 g Intravenous 3 times per day  . heparin  5,000 Units Subcutaneous 3 times per day  . lisinopril  5 mg Oral Daily  . [START ON 06/15/2015] pneumococcal 23 valent vaccine  0.5 mL Intramuscular Tomorrow-1000  . thiamine  100 mg Oral Daily  . traZODone  50 mg Oral QHS  . valACYclovir  1,000 mg Oral TID   . sodium chloride 20 mL/hr at 06/11/15 2000    ASSESSMENT AND PLAN: 1. AMS 2/2 drug  overdose requiring intubation - extubated on 10/19.   2. 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 On 6.25 mg coreg bid and will increase lisinopril to 5 mg daily. Plans for limited echo 10/26. If EF   improved, no further workup. If EF still low would continue ACEi and beta blocker. Titrate as BP allows and repeat Echo in 3 months.   3. Suicidal attempt - OD confirmed by family to be intentional, seen by psychiatry  4. RLL PNA:   - abx per IM  5. Elevated trop:   - likely demand ischemia related to tachycardia, AMS, acute resp failure, PNA and illicit drug   overdose  6. Hypokalemia:   - supplement ordered, recheck in am.  - he is now on solid food, was on clear liquids.   - unsure if there is underlying cause for low K+, further eval per MD  We will sign off at this point. Please call with further questions/concerns.   Principal Problem:   Intentional benzodiazepine overdose (HCC) Active Problems:   Acute respiratory failure (HCC)   Encephalopathy   HCAP (healthcare-associated pneumonia)   Secondary cardiomyopathy (HCC)   Hypokalemia   Signed, Theodora Lalanne [place] ,MD,FACC  9:16 AM 06/14/2015

## 2015-06-14 NOTE — Progress Notes (Signed)
Patient did attend the evening speaker AA meeting.  

## 2015-06-14 NOTE — Discharge Summary (Addendum)
Physician Discharge Summary  Johnny Fuller ZOX:[id number] DOB: June 13, 1996 DOA: 06/08/2015  PCP: No primary care provider on file.  Admit date: 06/08/2015 Discharge date: 06/14/2015  Time spent: 35 minutes  Recommendations for Outpatient Follow-up:  1. Psychiatry was consulted and recommended inpatient psychiatric evaluation. 2. Basic metabolic panel in 1 week. 3. Will need follow-up with cardiology in 1 week.  Discharge Diagnoses:  Principal Problem:   Intentional benzodiazepine overdose (HCC) Active Problems:   Acute respiratory failure (HCC)   Encephalopathy   HCAP (healthcare-associated pneumonia)   Secondary cardiomyopathy (HCC)   Hypokalemia   Discharge Condition: stable  Diet recommendation: regular  Filed Weights   06/12/15 0500 06/13/15 0500 06/14/15 0532  Weight: 65.2 kg (143 lb 11.8 oz) 65.1 kg (143 lb 8.3 oz) 59.5 kg (131 lb 2.8 oz)    History of present illness:  19 year old was drop in the hospital with a Glasgow comma score of 3, therefore proceeded with intubation on the day of admission for respiratory failure and unable to protect his airways. Admitted by [facility] Course:  Acute encephalopathy likely due to drug overdose/Intentional benzodiazepine overdose/suicide attempt: UDS positive for amphetamines and benzos, patient had a recent ED presentation to behavioral health for suicidal ideation. As the patient was not able to protect his airways patient was intubated and admitted by [facility]. UDS positive for amphetamines and benzodiazepine's.  Intubated on 06/09/2015, then extubated on 06/10/2015. Patient started on suicide precaution after extubation Neurology was consulted who recommended an MRI 06/12/2015 that showed no acute findings is some mild atrophy. EEG was ordered by neurology Psychiatry was consulted who recommended inpatient psychiatric evaluation for suicidal attempt. On my discussion with the patient he was belligerent, threatening the  staff and me verbally and saying that he would take legal actions for this admission. He keeps insulting physicians without their presence in the room, the psychiatrist and critical care doctors with my presence in the room, relating that he is going to take legal actions against them.  New diagnosis of LV dysfunction: By echo on 06/10/2015 that showedThe estimated ejection fraction was in the range of 35% to 40%. Diffuse hypokinesis. Left ventricular diastolic function parameters were normal Cardiology was consulted and recommended Coreg and lisinopril, his blood pressure has been able to tolerate this. He is currently asymptomatic.  Possible aspiration pneumonia: Started empirically on IV vancomycin and Zosyn, Zosyn was discontinued by PCCM on 06/12/2015. Sputum cultures and tracheal aspirate showed moderate staph sensitive to doxycycline. His antibiotic regimen was T escalated to doxycycline for 2 additional days.  Procedures:  2-D echo  MRI  CT head  Abdominal x-ray  Chest x-ray  Consultations:  PCCM  Cardiology  Neurology  Discharge Exam: Filed Vitals:   06/14/15 0532  BP: 126/60  Pulse: 70  Temp: 98.3 F (36.8 C)  Resp: 18    General: Awake alert and oriented 3 Cardiovascular: Regular rate and rhythm Respiratory: Good air movement clear to auscultation  Discharge Instructions   Discharge Instructions    Diet - low sodium heart healthy    Complete by:  As directed      Increase activity slowly    Complete by:  As directed           Current Discharge Medication List    START taking these medications   Details  carvedilol (COREG) 6.25 MG tablet Take 1 tablet (6.25 mg total) by mouth 2 (two) times daily with a meal. Qty: 30 tablet, Refills: 0  doxycycline (VIBRA-TABS) 100 MG tablet Take 1 tablet (100 mg total) by mouth every 12 (twelve) hours.    lisinopril (PRINIVIL,ZESTRIL) 5 MG tablet Take 1 tablet (5 mg total) by mouth daily. Qty: 30  tablet, Refills: 0      CONTINUE these medications which have NOT CHANGED   Details  clonazePAM (KLONOPIN) 1 MG tablet Take 1 mg by mouth daily as needed for anxiety.    nicotine (NICODERM CQ - DOSED IN MG/24 HOURS) 21 mg/24hr patch Place 21 mg onto the skin daily.    traZODone (DESYREL) 50 MG tablet Take 50 mg by mouth at bedtime.       Allergies  Allergen Reactions  . Sulfa Antibiotics Other (See Comments)    Unknown, childhood reaction      The results of significant diagnostics from this hospitalization (including imaging, microbiology, ancillary and laboratory) are listed below for reference.    Significant Diagnostic Studies: Ct Head Wo Contrast  06/09/2015  CLINICAL DATA:  Dropped off at hospital, unresponsive. Initial encounter. EXAM: CT HEAD WITHOUT CONTRAST TECHNIQUE: Contiguous axial images were obtained from the base of the skull through the vertex without intravenous contrast. COMPARISON:  None. FINDINGS: There is no evidence of acute infarction, mass lesion, or intra- or extra-axial hemorrhage on CT. The posterior fossa, including the cerebellum, brainstem and fourth ventricle, is within normal limits. The third and lateral ventricles, and basal ganglia are unremarkable in appearance. The cerebral hemispheres are symmetric in appearance, with normal gray-white differentiation. No mass effect or midline shift is seen. There is no evidence of fracture; visualized osseous structures are unremarkable in appearance. The visualized portions of the orbits are within normal limits. The paranasal sinuses and mastoid air cells are well-aerated. No significant soft tissue abnormalities are seen. IMPRESSION: Unremarkable noncontrast CT of the head. Electronically Signed   By: Roanna Raider M.D.   On: 06/09/2015 00:23   Mr Brain Wo Contrast  06/12/2015  CLINICAL DATA:  Encephalopathy. The patient is intubated and tail yesterday. EXAM: MRI HEAD WITHOUT CONTRAST TECHNIQUE: Multiplanar,  multiecho pulse sequences of the brain and surrounding structures were obtained without intravenous contrast. COMPARISON:  CT of the head without contrast 06/08/2015. FINDINGS: No acute infarct, hemorrhage, or mass lesion is present. The ventricles are of normal size. No significant extraaxial fluid collection is present. Brain does appear mildly atrophic. No significant white matter disease is present. The internal auditory canals are within normal limits. The brainstem is normal. Flow is present in the major intracranial arteries. Globes and orbits are intact. The paranasal sinuses and mastoid air cells are clear. The skullbase is normal.  Midline structures are unremarkable. IMPRESSION: 1. The brain appears mildly atrophic. This can be seen in the setting of sustained parental nutrition. 2. No significant white matter disease or focal abnormality to explain encephalopathy. The brain is otherwise normal. Electronically Signed   By: Marin Roberts M.D.   On: 06/12/2015 17:32   Dg Chest Port 1 View  06/11/2015  CLINICAL DATA:  Acute respiratory failure.  Altered mental status. EXAM: PORTABLE CHEST 1 VIEW COMPARISON:  Yesterday FINDINGS: Endotracheal tube tip between clavicular heads and carina. Orogastric tube reaches the stomach, which is gas distended. New airspace disease at the right base. Given history, question aspiration pneumonia. Normal heart size. No air leak or effusion. IMPRESSION: 1. New right basilar consolidation, likely pneumonia. Given the presentation this may reflect sequela of aspiration. 2. Endotracheal and orogastric tubes remain in good position. 3. The stomach is gas  distended. Electronically Signed   By: Marnee Spring M.D.   On: 06/11/2015 07:19   Dg Chest Port 1 View  06/10/2015  CLINICAL DATA:  Respiratory failure. EXAM: PORTABLE CHEST 1 VIEW COMPARISON:  06/08/2015. FINDINGS: Endotracheal tube and NG tube in stable position. Heart size stable. Mild bibasilar subsegmental  atelectasis. No pleural effusion or pneumothorax. IMPRESSION: 1. Lines and tubes in stable position. 2. Mild bibasilar subsegmental atelectasis. Electronically Signed   By: Maisie Fus  Register   On: 06/10/2015 07:17   Dg Chest Portable 1 View  06/09/2015  CLINICAL DATA:  Endotracheal tube placement.  Found unresponsive. EXAM: PORTABLE CHEST 1 VIEW COMPARISON:  None. FINDINGS: The endotracheal tube is 3.3 cm from the carina. Lung volumes are low. The cardiomediastinal contours are normal. Pulmonary vasculature is normal. No consolidation, pleural effusion, or pneumothorax. No acute osseous abnormalities are seen. IMPRESSION: 1. Endotracheal tube 3.3 cm from the carina. 2. Hypo aerated but clear lungs. Electronically Signed   By: Rubye Oaks M.D.   On: 06/09/2015 00:01   Dg Abd Portable 1v  06/09/2015  CLINICAL DATA:  Enteric tube placement.  Initial encounter. EXAM: PORTABLE ABDOMEN - 1 VIEW COMPARISON:  None. FINDINGS: The patient's enteric tube is seen ending overlying the body of the stomach. The visualized bowel gas pattern is unremarkable. Scattered air and stool filled loops of colon are seen; no abnormal dilatation of small bowel loops is seen to suggest small bowel obstruction. No free intra-abdominal air is identified, though evaluation for free air is limited on a single supine view. The visualized osseous structures are within normal limits; the sacroiliac joints are unremarkable in appearance. The visualized lung bases are essentially clear. IMPRESSION: Enteric tube seen ending overlying the body of the stomach. Electronically Signed   By: Roanna Raider M.D.   On: 06/09/2015 04:08    Microbiology: Recent Results (from the past 240 hour(s))  MRSA PCR Screening     Status: None   Collection Time: 06/09/15  2:07 AM  Result Value Ref Range Status   MRSA by PCR NEGATIVE NEGATIVE Final    Comment:        The GeneXpert MRSA Assay (FDA approved for NASAL specimens only), is one component of  a comprehensive MRSA colonization surveillance program. It is not intended to diagnose MRSA infection nor to guide or monitor treatment for MRSA infections.   Culture, Urine     Status: None   Collection Time: 06/10/15  3:29 PM  Result Value Ref Range Status   Specimen Description URINE, CATHETERIZED  Final   Special Requests NONE  Final   Culture NO GROWTH 1 DAY  Final   Report Status 06/11/2015 FINAL  Final  Culture, blood (routine x 2)     Status: None (Preliminary result)   Collection Time: 06/10/15  3:31 PM  Result Value Ref Range Status   Specimen Description BLOOD RIGHT HAND  Final   Special Requests BOTTLES DRAWN AEROBIC ONLY 5CC  Final   Culture NO GROWTH 4 DAYS  Final   Report Status PENDING  Incomplete  Culture, blood (routine x 2)     Status: None (Preliminary result)   Collection Time: 06/10/15  3:37 PM  Result Value Ref Range Status   Specimen Description BLOOD RIGHT ARM  Final   Special Requests IN PEDIATRIC BOTTLE  1CC  Final   Culture NO GROWTH 4 DAYS  Final   Report Status PENDING  Incomplete  Culture, respiratory (NON-Expectorated)     Status: None  Collection Time: 06/10/15  5:11 PM  Result Value Ref Range Status   Specimen Description TRACHEAL ASPIRATE  Final   Special Requests Normal  Final   Gram Stain   Final    ABUNDANT WBC PRESENT, PREDOMINANTLY PMN NO SQUAMOUS EPITHELIAL CELLS SEEN MODERATE GRAM POSITIVE COCCI IN PAIRS IN CLUSTERS Performed at [facility]    Culture   Final    MODERATE STAPHYLOCOCCUS AUREUS Note: RIFAMPIN AND GENTAMICIN SHOULD NOT BE USED AS SINGLE DRUGS FOR TREATMENT OF STAPH INFECTIONS. Performed at [facility]    Report Status 06/13/2015 FINAL  Final   Organism ID, Bacteria STAPHYLOCOCCUS AUREUS  Final      Susceptibility   Staphylococcus aureus - MIC*    CLINDAMYCIN <=0.25 SENSITIVE Sensitive     ERYTHROMYCIN <=0.25 SENSITIVE Sensitive     GENTAMICIN <=0.5 SENSITIVE Sensitive     LEVOFLOXACIN 0.25  SENSITIVE Sensitive     OXACILLIN 0.5 SENSITIVE Sensitive     RIFAMPIN <=0.5 SENSITIVE Sensitive     TRIMETH/SULFA <=10 SENSITIVE Sensitive     VANCOMYCIN 1 SENSITIVE Sensitive     TETRACYCLINE <=1 SENSITIVE Sensitive     MOXIFLOXACIN <=0.25 SENSITIVE Sensitive     * MODERATE STAPHYLOCOCCUS AUREUS     Labs: Basic Metabolic Panel:  Recent Labs Lab 06/09/15 0236  06/10/15 0215 06/11/15 0240 06/12/15 0056 06/13/15 0525 06/14/15 0448  NA 138  < > 139 138 136 142 141  K 2.7*  < > 3.3* 3.6 3.6 3.3* 4.3  CL 100*  < > 109 104 104 104 102  CO2 26  < > 23 25 25 27 30   GLUCOSE 91  < > 115* 117* 119* 157* 107*  BUN 5*  < > 6 7 5* <5* 9  CREATININE 0.81  < > 0.69 0.84 0.79 0.77 0.80  CALCIUM 8.7*  < > 8.3* 8.2* 7.9* 8.8* 9.4  MG 2.0  --  1.9 1.8 2.0  --   --   PHOS 4.0  --   --  2.9 2.2*  --   --   < > = values in this interval not displayed. Liver Function Tests:  Recent Labs Lab 06/08/15 2231  AST 64*  ALT 49  ALKPHOS 66  BILITOT 1.0  PROT 7.5  ALBUMIN 4.9    Recent Labs Lab 06/08/15 2231  LIPASE 29   No results for input(s): AMMONIA in the last 168 hours. CBC:  Recent Labs Lab 06/08/15 2231  06/10/15 0215 06/11/15 0240 06/12/15 0056 06/13/15 0525 06/14/15 0448  WBC 6.4  < > 8.1 15.9* 14.0* 8.0 5.5  NEUTROABS 3.6  --   --   --   --   --   --   HGB 15.6  < > 12.0* 12.8* 12.2* 12.7* 13.8  HCT 43.2  < > 34.2* 36.4* 34.6* 37.1* 40.0  MCV 86.9  < > 89.1 89.2 88.7 89.8 90.9  PLT 253  < > 189 170 195 250 318  < > = values in this interval not displayed. Cardiac Enzymes:  Recent Labs Lab 06/08/15 2336 06/09/15 0236 06/09/15 0840 06/09/15 1447 06/09/15 2030 06/10/15 0215  CKTOTAL 859* 771*  --   --   --  204  TROPONINI  --  0.43* 0.56* 0.34* 0.20*  --    BNP: BNP (last 3 results) No results for input(s): BNP in the last 8760 hours.  ProBNP (last 3 results) No results for input(s): PROBNP in the last 8760 hours.  CBG:  Recent Labs Lab  06/13/15 1619 06/13/15 2218 06/14/15 0002 06/14/15 0414 06/14/15 0805  GLUCAP 78 115* 88 109* 104*       Signed:  Marinda Elk  [facility] 06/14/2015, 10:15 AM

## 2015-06-14 NOTE — Progress Notes (Signed)
Pt [facility].  CSW called EMS dispatch to have [facility].

## 2015-06-14 NOTE — Progress Notes (Addendum)
06/14/15 Patient very angry with all staff, Wants to report all staff requesting all ID numbers, pulled out his IV out of his arm states I don,t need anymore.[facility], report called into facility.

## 2015-06-14 NOTE — Tx Team (Addendum)
Initial Interdisciplinary Treatment Plan   PATIENT STRESSORS: Traumatic event   PATIENT STRENGTHS: Ability for insight General fund of knowledge   PROBLEM LIST: Problem List/Patient Goals Date to be addressed Date deferred Reason deferred Estimated date of resolution  Depresion 06/14/15     Suicide Risk 06/14/2015     Anxiety      "I used t do illegal stuff"                                     DISCHARGE CRITERIA:  Improved stabilization in mood, thinking, and/or behavior Need for constant or close observation no longer present  PRELIMINARY DISCHARGE PLAN: Return to previous living arrangement  PATIENT/FAMIILY INVOLVEMENT: This treatment plan has been presented to and reviewed with the patient, Johnny Fuller, and/or family member.  The patient and family have been given the opportunity to ask questions and make suggestions.  Johnny Fuller, Johnny Fuller 06/14/2015, 4:23 PM

## 2015-06-15 DIAGNOSIS — R45851 Suicidal ideations: Secondary | ICD-10-CM

## 2015-06-15 DIAGNOSIS — F332 Major depressive disorder, recurrent severe without psychotic features: Principal | ICD-10-CM

## 2015-06-15 LAB — CULTURE, BLOOD (ROUTINE X 2)
CULTURE: NO GROWTH
CULTURE: NO GROWTH

## 2015-06-15 MED ORDER — HYDROXYZINE HCL 25 MG PO TABS
25.0000 mg | ORAL_TABLET | Freq: Two times a day (BID) | ORAL | Status: DC | PRN
  Filled 2015-06-15: qty 1
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
## 2015-06-15 NOTE — BHH Suicide Risk Assessment (Signed)
[facility] Admission Suicide Risk Assessment   Nursing information obtained from:  Patient Demographic factors:  Male, Caucasian Current Mental Status:  NA Loss Factors:  NA Historical Factors:  Impulsivity Risk Reduction Factors:  NA Total Time spent with patient: 45 minutes Principal Problem: MDD (major depressive disorder), recurrent severe, without psychosis ([facility]) Diagnosis:   Patient Active Problem List   Diagnosis Date Noted  . MDD (major depressive disorder), recurrent severe, without psychosis ([facility]) [F33.2] 06/14/2015  . 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 Hypokalemia [E87.6] 06/13/2015  . Intentional benzodiazepine overdose ([facility]) [T42.4X2A] 06/12/2015  . HCAP (healthcare-associated pneumonia) [J18.9] 06/12/2015  . Encephalopathy [G93.40]   . Acute respiratory failure ([facility]) [J96.00] 06/09/2015     Continued Clinical Symptoms:  Alcohol Use Disorder Identification Test Final Score (AUDIT): 0 The "Alcohol Use Disorders Identification Test", Guidelines for Use in Primary Care, Second Edition.  World Science writer [facility]). Score between 0-7:  no or low risk or alcohol related problems. Score between 8-15:  moderate risk of alcohol related problems. Score between 16-19:  high risk of alcohol related problems. Score 20 or above:  warrants further diagnostic evaluation for alcohol dependence and treatment.   CLINICAL FACTORS:   Depression:   Comorbid alcohol abuse/dependence Hopelessness Impulsivity Insomnia Recent sense of peace/wellbeing Severe Alcohol/Substance Abuse/Dependencies More than one psychiatric diagnosis Unstable or Poor Therapeutic Relationship Previous Psychiatric Diagnoses and Treatments Medical Diagnoses and Treatments/Surgeries   Musculoskeletal: Strength & Muscle Tone: within normal limits Gait & Station: normal Patient leans: N/A  Psychiatric Specialty Exam: Physical Exam  ROS  Blood pressure 119/64, pulse 96, temperature 98.1  F (36.7 C), temperature source Oral, resp. rate 14, height  (1.676 m), weight 56.7 kg (125 lb), SpO2 98 %.Body mass index is 20.19 kg/(m^2).  General Appearance: Disheveled and Guarded  Eye Contact::  Minimal  Speech:  Slow  Volume:  Normal  Mood:  Irritable and Worthless  Affect:  Inappropriate and Labile  Thought Process:  Circumstantial and Loose  Orientation:  Full (Time, Place, and Person)  Thought Content:  Rumination  Suicidal Thoughts:  Yes.  with intent/plan  Homicidal Thoughts:  No  Memory:  Immediate;   Fair Recent;   Fair Remote;   Fair  Judgement:  Impaired  Insight:  Lacking  Psychomotor Activity:  Increased and Restlessness  Concentration:  Fair  Recall:  [employer] of Knowledge:Fair  Language: Fair  Akathisia:  No  Handed:  Right  AIMS (if indicated):     Assets:  Communication Skills  Sleep:  Number of Hours: 6  Cognition: WNL  ADL's:  Intact     COGNITIVE FEATURES THAT CONTRIBUTE TO RISK:  Closed-mindedness, Loss of executive function, Polarized thinking and Thought constriction (tunnel vision)    SUICIDE RISK:   Moderate:  Frequent suicidal ideation with limited intensity, and duration, some specificity in terms of plans, no associated intent, good self-control, limited dysphoria/symptomatology, some risk factors present, and identifiable protective factors, including available and accessible social support.  PLAN OF CARE: Patient seen and chart revived. Patient admitted after taking intentional od on meds (135 Tylenol tab). He admitted he was depressed and suicidal but also high on crystal meth. He has limited insight and requires inpatient treatment and stabilization. Please see history and physical for more details.  Medical Decision Making:  New problem, with additional work up planned, Review of Psycho-Social Stressors (1), Review or order clinical lab tests (1), Decision to obtain old records (1), Review and summation of old records (  2),  Established Problem, Worsening (2), Review of Medication Regimen & Side Effects (2) and Review of New Medication or Change in Dosage (2)  I certify that inpatient services furnished can reasonably be expected to improve the patient's condition.   Dalan Cowger T. 06/15/2015, 11:25 AM

## 2015-06-15 NOTE — Progress Notes (Signed)
D) Pt has been attending the groups and interacting with his peers.  Given a klonopin .5 mg at 0903 for anxiety. Pt denies SI and HI. And writes "IDK for depression and hopelessness (I don't know). Rates anxiety at a 2. Pt is pleasant and has been paying attention and listening in all his groups. A) Given support, reassurance and praise. Encouragement provided. R) Denies SI and HI.

## 2015-06-15 NOTE — BHH Group Notes (Signed)
B[facility] Group Notes:  (Clinical Social Work)  06/15/2015  10:00-11:00AM  Summary of Progress/Problems:   The main focus of today's process group was to   1)  discuss the importance of adding supports  2)  define healthy supports versus unhealthy supports  3)  Discuss various healthy supports that can be put into place for various issues in life.  An emphasis was placed on using counselor, doctor, therapy groups, 12-step groups, and problem-specific support groups to expand supports.    The patient expressed full comprehension of the concepts presented, and agreed that there is a need to add more supports.  The patient made some good observations in group that were helpful to others, but at times he made flippant remarks that were not helpful, such as suggesting that a place to meet new friends is at a bar, and a way to improve self-esteem is to use drugs.  He specifically rejected the idea of a counselor, stating friends are better for him, although he ultimately stated that perhaps he feels that way because the friend he turns to is a Veterinary surgeoncounselor by profession.  Type of Therapy:  Process Group with Motivational Interviewing  Participation Level:  Active  Participation Quality:  Attentive, Redirectable, Sharing and Supportive  Affect:  Appropriate  Cognitive:  Alert  Insight:  Developing/Improving  Engagement in Therapy:  Engaged  Modes of Intervention:   Education, Support and Processing, Activity  Pilgrim's PrideMareida Grossman-Orr, LCSW
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
## 2015-06-15 NOTE — Plan of Care (Signed)
Problem: Diagnosis: Increased Risk For Suicide Attempt Goal: LTG-Patient Will Report Improved Mood and Deny Suicidal LTG (by discharge) Patient will report improved mood and deny suicidal ideation.  Outcome: Progressing Patient denies SI.  Patient verbally contracts for safety.     

## 2015-06-15 NOTE — [facility] Suicide Risk Assessment (Signed)
[facility] Adult Inpatient Family/Significant Other Suicide Prevention Education  Suicide Prevention Education:   Patient Refusal for Family/Significant Other Suicide Prevention Education: The patient has refused to provide written consent for family/significant other to be provided Family/Significant Other Suicide Prevention Education during admission and/or prior to discharge.  Physician notified.  CSW provided suicide prevention information with patient.    The suicide prevention education provided includes the following:  Suicide risk factors  Suicide prevention and interventions  National Suicide Hotline telephone number  [facility] assessment telephone number   City Emergency Assistance 911  County and/or Residential Mobile Crisis Unit telephone number   Tamrah Victorino Horton, LCSW 06/15/2015 10:00 AM   

## 2015-06-15 NOTE — Progress Notes (Signed)
Patient did attend the evening speaker AA meeting.  

## 2015-06-15 NOTE — Progress Notes (Signed)
D: Patient in the hallway on approach.  Patient state she had a good day.  Patient states hiss goal for today was to make it through the day.  Patient states he met his goal.  Patient states he visited with his parents today and he states it was a good visit.  Patient did states he is unsure what his plan will be at discharge.  Patient states he cannot promise that he will stay clean of drugs when discharged. Patient denies SI/HI and denies AVH. A: Staff to monitor Q 15 mins for safety.  Encouragement and support offered.  Scheduled medications administered per orders. R: Patient remains safe on the unit.  Patient attended group tonight.  Patient visible on the unit.  Patient taking administered medications.

## 2015-06-16 DIAGNOSIS — F1994 Other psychoactive substance use, unspecified with psychoactive substance-induced mood disorder: Secondary | ICD-10-CM | POA: Diagnosis present

## 2015-06-16 DIAGNOSIS — F1510 Other stimulant abuse, uncomplicated: Secondary | ICD-10-CM | POA: Diagnosis present

## 2015-06-16 MED ORDER — QUETIAPINE FUMARATE ER 50 MG PO TB24
50.0000 mg | ORAL_TABLET | Freq: Every day | ORAL | Status: DC
  Administered 2015-06-16 – 2015-06-22 (×7): 50 mg via ORAL
  Filled 2015-06-16 (×9): qty 1

## 2015-06-16 MED ORDER — CITALOPRAM HYDROBROMIDE 10 MG PO TABS
10.0000 mg | ORAL_TABLET | Freq: Every day | ORAL | Status: DC
  Administered 2015-06-16 – 2015-06-18 (×3): 10 mg via ORAL
  Filled 2015-06-16 (×6): qty 1

## 2015-06-16 MED ORDER — MIRTAZAPINE 7.5 MG PO TABS
7.5000 mg | ORAL_TABLET | Freq: Every day | ORAL | Status: DC
  Administered 2015-06-16 – 2015-06-17 (×2): 7.5 mg via ORAL
  Filled 2015-06-16 (×3): qty 1

## 2015-06-16 NOTE — Progress Notes (Signed)
[facility] MD Progress Note  06/16/2015 3:16 PM Johnny Fuller  MRN:  [id number] Subjective:  Johnny Fuller states the he just wanted to see what happens when one dies. He was on Meth. He admits that he was upset when he did not get paid for his work couple of days before but states this was not the reason for what he did. He states he took few Trazodone but a lot of over the counter sleep aids. He was in his room passed out when his "drug dealer" who he allows to keep a key of the place walked in. He grabbed him and took him to the ED. He is not sure where to go from here. States he did not like how Trazodone made him feel last night and during the day. He is also stating he has a lot of anxiety during the day. Asked about Seroquel and about having a long acting medication Principal Problem: Substance induced mood disorder (HCC) Diagnosis:   Patient Active Problem List   Diagnosis Date Noted  . Substance induced mood disorder (HCC) [F19.94] 06/16/2015  . 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 MDD (major depressive disorder), recurrent severe, without psychosis (HCC) [F33.2] 06/14/2015  . 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 Hypokalemia [E87.6] 06/13/2015  . Intentional benzodiazepine overdose (HCC) [T42.4X2A] 06/12/2015  . HCAP (healthcare-associated pneumonia) [J18.9] 06/12/2015  . Encephalopathy [G93.40]   . Acute respiratory failure (HCC) [J96.00] 06/09/2015   Total Time spent with patient: 30 minutes  Past Psychiatric History: see admission H and PE  Past Medical History: History reviewed. No pertinent past medical history.  Past Surgical History  Procedure Laterality Date  . No past surgeries     Family History:  Family History  Problem Relation Age of Onset  . CAD Neg Hx    Family Psychiatric  History: See admission H and P Social History:  History  Alcohol Use No     History  Drug Use  . Yes    Social History   Social History  . Marital Status: Single     Spouse Name: N/A  . Number of Children: N/A  . Years of Education: N/A   Social History Main Topics  . Smoking status: Current Some Day Smoker  . Smokeless tobacco: None  . Alcohol Use: No  . Drug Use: Yes  . Sexual Activity: Not Asked   Other Topics Concern  . None   Social History Narrative   Additional Social History:    Pain Medications: none Prescriptions: none Over the Counter: none History of alcohol / drug use?: Yes                    Sleep: Poor  Appetite:  Fair  Current Medications: Current Facility-Administered Medications  Medication Dose Route Frequency Provider Last Rate Last Dose  . acetaminophen (TYLENOL) tablet 650 mg  650 mg Oral Q6H PRN Beau Fanny, FNP      . alum & mag hydroxide-simeth (MAALOX/MYLANTA) 200-200-20 MG/5ML suspension 30 mL  30 mL Oral Q4H PRN Beau Fanny, FNP      . carvedilol (COREG) tablet 6.25 mg  6.25 mg Oral BID WC Beau Fanny, FNP   6.25 mg at 06/16/15 1000  . citalopram (CELEXA) tablet 10 mg  10 mg Oral Daily Beau Fanny, FNP   10 mg at 06/16/15 1142  . clonazePAM (KLONOPIN) tablet 0.5 mg  0.5 mg Oral BID PRN Beau Fanny, FNP   0.5 mg  at 06/15/15 2007  . hydrOXYzine (ATARAX/VISTARIL) tablet 25 mg  25 mg Oral BID BM PRN Cleotis NipperSyed T Arfeen, MD      . magnesium hydroxide (MILK OF MAGNESIA) suspension 30 mL  30 mL Oral Daily PRN Beau FannyJohn C Withrow, FNP      . mirtazapine (REMERON) tablet 7.5 mg  7.5 mg Oral QHS Rachael FeeIrving A Maryl Blalock, MD      . nicotine (NICODERM CQ - dosed in mg/24 hours) patch 21 mg  21 mg Transdermal Q0600 Rachael FeeIrving A Amariss Detamore, MD   21 mg at 06/16/15 1001  . QUEtiapine (SEROQUEL XR) 24 hr tablet 50 mg  50 mg Oral Daily Rachael FeeIrving A Kajuan Guyton, MD   50 mg at 06/16/15 1300    Lab Results: No results found for this or any previous visit (from the past 48 hour(s)).  Physical Findings: AIMS: Facial and Oral Movements Muscles of Facial Expression: None, normal Lips and Perioral Area: None, normal Jaw: None, normal Tongue: None,  normal,Extremity Movements Upper (arms, wrists, hands, fingers): None, normal Lower (legs, knees, ankles, toes): None, normal, Trunk Movements Neck, shoulders, hips: None, normal, Overall Severity Severity of abnormal movements (highest score from questions above): None, normal Incapacitation due to abnormal movements: None, normal Patient's awareness of abnormal movements (rate only patient's report): No Awareness, Dental Status Current problems with teeth and/or dentures?: No Does patient usually wear dentures?: No  CIWA:    COWS:     Musculoskeletal: Strength & Muscle Tone: within normal limits Gait & Station: normal Patient leans: normal  Psychiatric Specialty Exam: Review of Systems  Constitutional: Negative.   HENT: Negative.   Eyes: Negative.   Respiratory: Negative.   Cardiovascular: Negative.   Gastrointestinal: Negative.   Genitourinary: Negative.   Musculoskeletal: Negative.   Skin: Negative.   Neurological: Negative.   Endo/Heme/Allergies: Negative.   Psychiatric/Behavioral: Positive for depression and substance abuse. The patient is nervous/anxious and has insomnia.     Blood pressure 119/64, pulse 96, temperature 98.1 F (36.7 C), temperature source Oral, resp. rate 14, height 5\' 6"  (1.676 m), weight 56.7 kg (125 lb), SpO2 98 %.Body mass index is 20.19 kg/(m^2).  General Appearance: Fairly Groomed  Patent attorneyye Contact::  Fair  Speech:  Clear and Coherent  Volume:  Decreased  Mood:  Anxious  Affect:  Restricted  Thought Process:  Coherent and Goal Directed  Orientation:  Full (Time, Place, and Person)  Thought Content:  symptoms events worries concerns  Suicidal Thoughts:  He states he still sees no sense in living   Homicidal Thoughts:  No  Memory:  Immediate;   Fair Recent;   Fair Remote;   Fair  Judgement:  Fair  Insight:  Present and Shallow  Psychomotor Activity:  Normal  Concentration:  Fair  Recall:  FiservFair  Fund of Knowledge:Fair  Language: Fair   Akathisia:  No  Handed:  Right  AIMS (if indicated):     Assets:  Desire for Improvement Housing Vocational/Educational  ADL's:  Intact  Cognition: WNL  Sleep:  Number of Hours: 6.75   Treatment Plan Summary: Daily contact with patient to assess and evaluate symptoms and progress in treatment and Medication management Supportive approach/coping skills Substance abuse; will work a relapse prevention plan Anxiety; will have a trial of Seroquel XR 50 mg in AM, will continue the Celexa and plan to increase it to 20 mg Insomnia; did not do well with Trazodone, will have a trial with Remeron 7.5 mg HS thinking that it can help with sleep  and anxiety Will work with CBT/mindfulness   Johnny Fuller A 06/16/2015, 3:16 PM

## 2015-06-16 NOTE — [facility] Group Notes (Signed)
[facility] LCSW Group Therapy 06/16/2015  1:15 pm  Type of Therapy: Group Therapy Participation Level: Minimal  Participation Quality: Minimal  Affect: Depressed and Flat  Cognitive: Alert and Oriented  Insight: Developing/Improving and Engaged  Engagement in Therapy: Developing/Improving and Engaged  Modes of Intervention: Clarification, Confrontation, Discussion, Education, Exploration,  Limit-setting, Orientation, Problem-solving, Rapport Building, Dance movement psychotherapisteality Testing, Socialization and Support  Summary of Progress/Problems: Pt identified obstacles faced currently and processed barriers involved in overcoming these obstacles. Pt identified steps necessary for overcoming these obstacles and explored motivation (internal and external) for facing these difficulties head on. Pt further identified one area of concern in their lives and chose a goal to focus on for today. Patient declined to participate in discussion despite CSW encouragement.  Samuella BruinKristin Milind Raether, MSW, [employer]CSWA Clinical Social Worker [facility] [phone]

## 2015-06-16 NOTE — Progress Notes (Signed)
Recreation Therapy Notes  Date: 10.24.2016 Time: 9:30am Location: [address] Hall Group Room   Group Topic: Stress Management  Goal Area(s) Addresses:  Patient will actively participate in stress management techniques presented during session.   Behavioral Response: Did not attend.   Dionisio Aragones L Vaeda Westall, LRT/CTRS        Litzi Binning L 06/16/2015 9:59 AM 

## 2015-06-16 NOTE — Progress Notes (Signed)
Pt attended spiritual care group on grief and loss facilitated by chaplain Burnis KingfisherMatthew Lurlie Wigen and counseling intern [facility] Beam. Group opened with brief discussion and psycho-social ed around grief and loss in relationships and in relation to self - identifying life patterns, circumstances, changes that cause losses. Established group norm of speaking from own life experience. Group goal of establishing open and affirming space for members to share loss and experience with grief, normalize grief experience and provide psycho social education and grief support.      Tasia CatchingsCraig was present throughout group.  He was not vocal and did not contribute to discussion.  When facilitators opened space for Tasia CatchingsCraig to speak, he declined.    Belva CromeStalnaker, Katrina Daddona Wayne MDiv

## 2015-06-16 NOTE — Progress Notes (Signed)
Patient ID: Johnny Fuller, male   DOB: 07-02-1996, 19 y.o.   MRN: [id number]  D: Patient drowsy this am but pleasant on approach. Gives depression "4", hopelessness "0" and anxiety "7". Denies current SI and contracts on the unit. Conversation has loose associations at times and takes a couple of minutes to figure out. A: Staff will continue to monitor on q 15 minute checks, follow treatment plan, and give medications as ordered. R: Cooperative on the unit.

## 2015-06-16 NOTE — Progress Notes (Signed)
D:Patient in the dayroom on approach.  Patient states he is feeling better today.  Patient does have flat affect and appears depressed.  Patient rates depression 5/10.  Patient states he usually gets his happiness from illicit drugs.  Patient states he is going to have to find happiness in other things now.  Patient states his Malen GauzeFoster mother visited today and he states it was a good visit.  Patient denies SI/HI and denies AVH. A: Staff to monitor Q 15 mins for safety.  Encouragement and support offered.  Scheduled medications administered per orders. R: Patient remains safe on the unit.  Patient attended group tonight.  Patient visible on the unit and does not appear to be interacting with peers.  Patient taking administered medications.

## 2015-06-16 NOTE — Progress Notes (Signed)
Adult Psychoeducational Group Note  Date:  06/16/2015 Time:  8:41 PM  Pt attended evening AA group.    Johnny NeerJasmine S Zae Fuller 06/16/2015, 8:41 PM

## 2015-06-16 NOTE — Tx Team (Addendum)
Interdisciplinary Treatment Plan Update (Adult) Date: 06/16/2015   Time Reviewed: 9:30 AM  Progress in Treatment: Attending groups: Minimally Participating in groups: Minimally Taking medication as prescribed: Yes Tolerating medication: Yes Family/Significant other contact made: No, patient has declined collateral contact Patient understands diagnosis: Yes Discussing patient identified problems/goals with staff: Yes Medical problems stabilized or resolved: Yes Denies suicidal/homicidal ideation: Yes Issues/concerns per patient self-inventory: Yes Other:  New problem(s) identified: N/A  Discharge Plan or Barriers: Interested in [facility] or [employer] (listing provided and patient encouraged to call). Patient appears to have minimal motivation to stop using as evidenced by his expression of ambivalence regarding recovery, minimizing his use and it's negative consequences in his life, and minimal participation in groups    Reason for Continuation of Hospitalization:  Depression Anxiety Medication Stabilization   Comments: N/A  Estimated length of stay: 3-5 days   Patient is a 19 year old Caucasian Male with a diagnosis of Amphetamine-type Use Disorder. Patient lives in [place] alone. Pt reports coming to the hospital after overdosing on tylenol and Klonopin. Pt states that he is unsure if it was a suicide attempt or not, because he was high when he did it. Pt presents guarded, evasive and questioned why CSW had to ask all of these questions. Pt states that he uses crystal meth regularly, with no plans to stop using. Pt states that he was referred to a psychiatrist at last admission but failed to follow up because he got high instead. Patient will benefit from crisis stabilization, medication evaluation, group therapy and psycho education in addition to case management for discharge planning. Discharge Process and Patient Expectations information sheet signed by  patient, witnessed by writer and inserted in patient's shadow chart.    Review of initial/current patient goals per problem list:  1. Goal(s): Patient will participate in aftercare plan   Met: No   Target date: 3-5 days post admission date   As evidenced by: Patient will participate within aftercare plan AEB aftercare provider and housing plan at discharge being identified.  10/24: Goal progressing: Interested in [facility] or [employer] (listing provided and patient encouraged to call). Patient appears to have minimal motivation to stop using as evidenced by his expression of ambivalence regarding recovery and minimal participation in groups       2. Goal (s): Patient will exhibit decreased depressive symptoms and suicidal ideations.   Met: Goal Progressing   Target date: 3-5 days post admission date   As evidenced by: Patient will utilize self rating of depression at 3 or below and demonstrate decreased signs of depression or be deemed stable for discharge by MD.  10/24: Goal Progressing.       3. Goal(s): Patient will demonstrate decreased signs and symptoms of anxiety.   Met: Goal Progressing   Target date: 3-5 days post admission date   As evidenced by: Patient will utilize self rating of anxiety at 3 or below and demonstrated decreased signs of anxiety, or be deemed stable for discharge by MD  10/24: Goal Progressing.      4. Goal(s): Patient will demonstrate decreased signs of withdrawal due to substance abuse   Met: Yes   Target date: 3-5 days post admission date   As evidenced by: Patient will produce a CIWA/COWS score of 0, have stable vitals signs, and no symptoms of withdrawal  10/24: Goal met: No withdrawal symptoms reported at this time per medical chart.       Attendees: Patient:  Family:    Physician: Dr. Parke Poisson; Dr. Sabra Heck 06/16/2015 9:30 AM  Nursing:  Maureen Chatters, Sharyn Lull ,RN 06/16/2015 9:30 AM  Clinical  Social Worker: Tilden Fossa,  [place] 06/16/2015 9:30 AM  Other: Louie Bun Smart LCSWA  06/16/2015 9:30 AM  Other: Lucinda Dell, Beverly Sessions Liaison 06/16/2015 9:30 AM  Other: Lars Pinks, Case Manager 06/16/2015 9:30 AM  Other:  Catalina Pizza, NP 06/16/2015 9:30 AM  Other:    Other:      Scribe for Treatment Team:  Tilden Fossa, MSW, [place] [phone]

## 2015-06-16 NOTE — [facility] Group Notes (Addendum)
   [facility]  06/16/2015  8:45 AM   Participation Quality: Alert, Appropriate and Oriented  Mood/Affect: Depressed and Flat  Depression Rating: 3-4  Anxiety Rating: 7-8  Thoughts of Suicide: Pt denies SI/HI  Will you contract for safety? Yes  Current AVH: Pt denies  Plan for Discharge/Comments: Pt attended discharge planning group and actively participated in group. CSW provided pt with today's workbook. Patient reported not wanting to be here and did not disclose much about his discharge plans.   Transportation Means: Pt reports access to transportation  Supports: No supports mentioned at this time  Samuella BruinKristin Khari Mally, MSW, [employer]CSWA Clinical Social Worker [facility] [phone]

## 2015-06-17 MED ORDER — INFLUENZA VAC SPLIT QUAD 0.5 ML IM SUSY
0.5000 mL | PREFILLED_SYRINGE | INTRAMUSCULAR | Status: AC
  Administered 2015-06-18: 0.5 mL via INTRAMUSCULAR
  Filled 2015-06-17: qty 0.5

## 2015-06-17 MED ORDER — PNEUMOCOCCAL VAC POLYVALENT 25 MCG/0.5ML IJ INJ: 0.5000 mL | INJECTION | INTRAMUSCULAR | Status: DC

## 2015-06-17 NOTE — Plan of Care (Signed)
Problem: Diagnosis: Increased Risk For Suicide Attempt Goal: STG-Patient Will Comply With Medication Regime Outcome: Progressing Patient is med compliant.  Problem: Alteration in mood; excessive anxiety as evidenced by: Goal: STG-Pt will report an absence of self-harm thoughts/actions (Patient will report an absence of self-harm thoughts or actions)  Outcome: Progressing Patient denies SI, thoughts/urges to harm self.

## 2015-06-17 NOTE — Plan of Care (Signed)
Problem: Diagnosis: Increased Risk For Suicide Attempt Goal: STG-Patient Will Comply With Medication Regime Outcome: Progressing Pt complies with prescribed medications.  Problem: Alteration in mood; excessive anxiety as evidenced by: Goal: STG-Patient can identify triggers for anxiety Outcome: Not Progressing Pt not able to verbalize triggers for anxiety when asked. Goal: STG-Pt will report an absence of self-harm thoughts/actions (Patient will report an absence of self-harm thoughts or actions)  Outcome: Progressing Pt denies SI.

## 2015-06-17 NOTE — Progress Notes (Signed)
Pt attended the AA speaker meeting. Pt was attentive and engaged. 

## 2015-06-17 NOTE — [facility] Group Notes (Signed)
[facility] Group Notes:  (Nursing/MHT/Case Management/Adjunct)  Date:  06/17/2015  Time:  0900  Type of Therapy:  Nurse Education - Skills for Recovery  Participation Level:  None  Participation Quality:  Attentive  Affect:  Flat and Irritable  Cognitive:  Oriented  Insight:  Limited  Engagement in Group:  Lacking  Modes of Intervention:  Education  Summary of Progress/Problems: Patient present for group however did not participate in discussion.   Merian CapronFriedman, Doretta Remmert [facility] 06/17/2015, 0930

## 2015-06-17 NOTE — Progress Notes (Signed)
D: Pt found in dayroom upon assessment. Pt denies SI/HI/AVH at this time. Pt reports pain rated a 2/10 on his side. Pain occurs when he takes a deep breath. Pt reports that he feels that his medications are working, but "I don't want them to help, they make me feel all of my emotions and I don't want to feel them." Pt attended AA group this evening. A: Emotional support and reassurance given. Pt encouraged to continue attending and participating in groups. q15 minute safety checks. R: Safety is maintained.

## 2015-06-17 NOTE — Plan of Care (Signed)
Problem: Diagnosis: Increased Risk For Suicide Attempt Goal: STG-Patient Will Attend All Groups On The Unit Outcome: Progressing Patient states he has been attending all groups.  Patient attended AA group tonight.

## 2015-06-17 NOTE — [facility] Group Notes (Signed)
[facility] LCSW Group Therapy  06/17/2015 1:05 PM  Type of Therapy:  Group Therapy  Participation Level:  Minimal  Participation Quality:  Attentive  Affect:  Depressed  Cognitive:  Oriented  Insight:  Improving  Engagement in Therapy:  Improving  Modes of Intervention:  Discussion, Education, Exploration, Problem-solving, Rapport Building, Socialization and Support  Summary of Progress/Problems: [facility] Speaker came to talk about his personal journey with substance abuse and addiction. The pt processed ways by which to relate to the speaker. [facility] speaker provided handouts and educational information pertaining to groups and services offered by the [facility].   Smart, Marrietta Thunder LCSWA  06/17/2015, 1:05 PM

## 2015-06-17 NOTE — [facility] Group Notes (Signed)
[facility] LCSW Group Therapy 06/17/2015 1:15 PM Type of Therapy: Group Therapy Participation Level: Active  Participation Quality: Attentive, Sharing and Supportive  Affect: Depressed and Flat  Cognitive: Alert and Oriented  Insight: Developing/Improving and Engaged  Engagement in Therapy: Developing/Improving and Engaged  Modes of Intervention: Activity, Clarification, Confrontation, Discussion, Education, Exploration, Limit-setting, Orientation, Problem-solving, Rapport Building, Reality Testing, Socialization and Support  Summary of Progress/Problems: Patient was attentive and engaged with speaker from [employer]. Patient was attentive to speaker while they shared their story of dealing with mental health and overcoming it. Patient expressed interest in their programs and services and received information on their agency. Patient processed ways they can relate to the speaker.   Carilyn Woolston, MSW, LCSWA Clinical Social Worker [facility] [phone]   

## 2015-06-17 NOTE — Progress Notes (Signed)
[facility]  06/17/2015 7:46 PM Johnny Fuller  MRN:  [id number] Subjective:  Johnny CatchingsCraig continues to say he was not trying to kill himself this time around although this time was very close. He still glorifies the way he feels on Meth. He is willing to work on abstaining. States he has used drugs before but meth "got me"  Principal Problem: Substance induced mood disorder (HCC) Diagnosis:   Patient Active Problem List   Diagnosis Date Noted  . Substance induced mood disorder (HCC) [F19.94] 06/16/2015  . 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 MDD (major depressive disorder), recurrent severe, without psychosis (HCC) [F33.2] 06/14/2015  . 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 Hypokalemia [E87.6] 06/13/2015  . Intentional benzodiazepine overdose (HCC) [T42.4X2A] 06/12/2015  . HCAP (healthcare-associated pneumonia) [J18.9] 06/12/2015  . Encephalopathy [G93.40]   . Acute respiratory failure (HCC) [J96.00] 06/09/2015   Total Time spent with patient: 30 minutes  Past Psychiatric History: see admission H and P  Past Medical History: History reviewed. No pertinent past medical history.  Past Surgical History  Procedure Laterality Date  . No past surgeries     Family History:  Family History  Problem Relation Age of Onset  . CAD Neg Hx    Family Psychiatric  History: see admission H and P Social History:  History  Alcohol Use No     History  Drug Use  . Yes    Social History   Social History  . Marital Status: Single    Spouse Name: N/A  . Number of Children: N/A  . Years of Education: N/A   Social History Main Topics  . Smoking status: Current Some Day Smoker  . Smokeless tobacco: None  . Alcohol Use: No  . Drug Use: Yes  . Sexual Activity: Not Asked   Other Topics Concern  . None   Social History Narrative   Additional Social History:    Pain Medications: none Prescriptions: none Over the Counter: none History of alcohol / drug use?:  Yes                    Sleep: Fair  Appetite:  Fair  Current Medications: Current Facility-Administered Medications  Medication Dose Route Frequency Provider Last Rate Last Dose  . acetaminophen (TYLENOL) tablet 650 mg  650 mg Oral Q6H PRN Beau FannyJohn C Withrow, FNP      . alum & mag hydroxide-simeth (MAALOX/MYLANTA) 200-200-20 MG/5ML suspension 30 mL  30 mL Oral Q4H PRN Beau FannyJohn C Withrow, FNP      . carvedilol (COREG) tablet 6.25 mg  6.25 mg Oral BID WC Beau FannyJohn C Withrow, FNP   6.25 mg at 06/17/15 1718  . citalopram (CELEXA) tablet 10 mg  10 mg Oral Daily Beau FannyJohn C Withrow, FNP   10 mg at 06/17/15 [id number]  . clonazePAM (KLONOPIN) tablet 0.5 mg  0.5 mg Oral BID PRN Beau FannyJohn C Withrow, FNP   0.5 mg at 06/15/15 2007  . hydrOXYzine (ATARAX/VISTARIL) tablet 25 mg  25 mg Oral BID BM PRN Cleotis NipperSyed T Arfeen, MD      . magnesium hydroxide (MILK OF MAGNESIA) suspension 30 mL  30 mL Oral Daily PRN Beau FannyJohn C Withrow, FNP      . mirtazapine (REMERON) tablet 7.5 mg  7.5 mg Oral QHS Rachael FeeIrving A Baley Lorimer, MD   7.5 mg at 06/16/15 2118  . nicotine (NICODERM CQ - dosed in mg/24 hours) patch 21 mg  21 mg Transdermal Q0600 Rachael FeeIrving A Johniya Durfee, MD  21 mg at 06/17/15 0821  . QUEtiapine (SEROQUEL XR) 24 hr tablet 50 mg  50 mg Oral Daily Rachael Fee, MD   50 mg at 06/17/15 1610    Lab Results: No results found for this or any previous visit (from the past 48 hour(s)).  Physical Findings: AIMS: Facial and Oral Movements Muscles of Facial Expression: None, normal Lips and Perioral Area: None, normal Jaw: None, normal Tongue: None, normal,Extremity Movements Upper (arms, wrists, hands, fingers): None, normal Lower (legs, knees, ankles, toes): None, normal, Trunk Movements Neck, shoulders, hips: None, normal, Overall Severity Severity of abnormal movements (highest score from questions above): None, normal Incapacitation due to abnormal movements: None, normal Patient's awareness of abnormal movements (rate only patient's report): No  Awareness, Dental Status Current problems with teeth and/or dentures?: No Does patient usually wear dentures?: No  CIWA:    COWS:     Musculoskeletal: Strength & Muscle Tone: within normal limits Gait & Station: normal Patient leans: normal  Psychiatric Specialty Exam: Review of Systems  Constitutional: Negative.   HENT: Negative.   Eyes: Negative.   Respiratory: Negative.   Cardiovascular: Negative.   Gastrointestinal: Negative.   Genitourinary: Negative.   Musculoskeletal: Negative.   Skin: Negative.   Neurological: Negative.   Endo/Heme/Allergies: Negative.   Psychiatric/Behavioral: Positive for depression and substance abuse.    Blood pressure 138/62, pulse 78, temperature 97.8 F (36.6 C), temperature source Oral, resp. rate 16, height  (1.676 m), weight 56.7 kg (125 lb), SpO2 98 %.Body mass index is 20.19 kg/(m^2).  General Appearance: Fairly Groomed  Patent attorney::  Fair  Speech:  Clear and Coherent and Slow  Volume:  Decreased  Mood:  Anxious and Depressed  Affect:  Restricted  Thought Process:  Coherent and Goal Directed  Orientation:  Full (Time, Place, and Person)  Thought Content:  symptoms events worries concerns  Suicidal Thoughts:  No  Homicidal Thoughts:  No  Memory:  Immediate;   Fair Recent;   Fair Remote;   Fair  Judgement:  Fair  Insight:  Present  Psychomotor Activity:  Decreased  Concentration:  Fair  Recall:  [employer] of Knowledge:Fair  Language: Fair  Akathisia:  No  Handed:  Right  AIMS (if indicated):     Assets:  Desire for Improvement Housing Social Support  ADL's:  Intact  Cognition: WNL  Sleep:  Number of Hours: 7   Treatment Plan Summary: Daily contact with patient to assess and evaluate symptoms and progress in treatment and Medication management Supportive approach/coping skills Meth abuse-dependence; will continue to work a relapse prevention plan Depression; continue to work with the Celexa 10 mg and increase to  20 mg Insomnia; continue the Remeron 7.5 mg HS Anxiety; continue the trial with Seroquel XR 50 mg in AM Use CBT/mindfulness Explore residential treatment options Khy Pitre A 06/17/2015, 7:46 PM

## 2015-06-17 NOTE — Progress Notes (Signed)
Patient flat, depressed and withdrawn. "Today is just a down day for me. I don't know why." Rates depression at a 7/10, hopelessness at a 3/10 and anxiety at a 2/10. Reports his goal is to attend groups and so far, patient has attended all. When not in group, patient resting in bed. Patient offered emotional support and reassurance. Medicated per orders. Coreg held due to low diastolic readings (per Dr. Dub MikesLugo). Patient denies SI/HI and remains safe. Lawrence MarseillesFriedman, Braydee Shimkus Eakes

## 2015-06-18 ENCOUNTER — Ambulatory Visit (HOSPITAL_COMMUNITY)
Admit: 2015-06-18 | Discharge: 2015-06-18 | Disposition: A | Discharge status: Home / Self Care | Payer: Medicaid Other | Source: Ambulatory Visit | Attending: Family | Admitting: Family

## 2015-06-18 DIAGNOSIS — R52 Pain, unspecified: Secondary | ICD-10-CM | POA: Insufficient documentation

## 2015-06-18 DIAGNOSIS — R079 Chest pain, unspecified: Secondary | ICD-10-CM | POA: Insufficient documentation

## 2015-06-18 DIAGNOSIS — M79622 Pain in left upper arm: Secondary | ICD-10-CM | POA: Insufficient documentation

## 2015-06-18 MED ORDER — IBUPROFEN 600 MG PO TABS
600.0000 mg | ORAL_TABLET | Freq: Four times a day (QID) | ORAL | Status: DC | PRN
  Administered 2015-06-18 – 2015-06-23 (×10): 600 mg via ORAL
  Filled 2015-06-18 (×10): qty 1

## 2015-06-18 MED ORDER — CITALOPRAM HYDROBROMIDE 20 MG PO TABS
20.0000 mg | ORAL_TABLET | Freq: Every day | ORAL | Status: DC
  Administered 2015-06-19 – 2015-06-22 (×4): 20 mg via ORAL
  Filled 2015-06-18 (×6): qty 1

## 2015-06-18 MED ORDER — QUETIAPINE FUMARATE 25 MG PO TABS
25.0000 mg | ORAL_TABLET | Freq: Four times a day (QID) | ORAL | Status: DC | PRN
  Administered 2015-06-18 – 2015-06-23 (×11): 25 mg via ORAL
  Filled 2015-06-18 (×11): qty 1

## 2015-06-18 MED ORDER — MIRTAZAPINE 15 MG PO TABS
15.0000 mg | ORAL_TABLET | Freq: Every day | ORAL | Status: DC
  Administered 2015-06-18 – 2015-06-22 (×5): 15 mg via ORAL
  Filled 2015-06-18 (×7): qty 1

## 2015-06-18 NOTE — Progress Notes (Signed)
Recreation Therapy Notes  Date: 10.26.2016 Time: 9:30am Location: [address] Group Room   Group Topic: Stress Management  Goal Area(s) Addresses:  Patient will actively participate in stress management techniques presented during session.   Behavioral Response: Did not attend.   Efton Thomley L Kamla Skilton, LRT/CTRS        Donnette Macmullen L 06/18/2015 2:49 PM 

## 2015-06-18 NOTE — [facility] Group Notes (Signed)
[facility] LCSW Group Therapy 06/18/2015  1:15 PM   Type of Therapy: Group Therapy  Participation Level: Did Not Participate. Patient left at the beginning of group and did not return.   Samuella BruinKristin Fleurette Woolbright, MSW, [employer] IncLCSWA Clinical Social Worker [facility] [phone]

## 2015-06-18 NOTE — Progress Notes (Addendum)
Pt was invited to attend the NA speaker meeting, however, declined and stayed in his room and slept.

## 2015-06-18 NOTE — Progress Notes (Signed)
Patient evaluated by MD, NP as he is complaining of pain upon inhalation. [facility] Patient willing to sign in voluntarily and MD is in agreement. Radiology notified along with [facility]. Awaiting transport. Patient verbalizes understanding. In NAD. Lawrence MarseillesFriedman, Lucian Baswell Eakes

## 2015-06-18 NOTE — Progress Notes (Signed)
[facility] MD Progress Note  06/18/2015 3:59 PM Johnny Fuller  MRN:  [id number] Subjective:  [name] admits to an underlying depression. He feels better when he is high. He states that without the drugs he is dealing with a lot of feeling all tangled up. States he feels guilty for not having been more open with his foster mother. Thinks the Seroquel is helping with the anxiety and he can be in touch with the underlying feelings better. Shares the fact that there have been a number of suicides in his family members Principal Problem: Substance induced mood disorder (HCC) Diagnosis:   Patient Active Problem List   Diagnosis Date Noted  . Substance induced mood disorder (HCC) [F19.94] 06/16/2015  . 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 MDD (major depressive disorder), recurrent severe, without psychosis (HCC) [F33.2] 06/14/2015  . 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 Hypokalemia [E87.6] 06/13/2015  . Intentional benzodiazepine overdose (HCC) [T42.4X2A] 06/12/2015  . HCAP (healthcare-associated pneumonia) [J18.9] 06/12/2015  . Encephalopathy [G93.40]   . Acute respiratory failure (HCC) [J96.00] 06/09/2015   Total Time spent with patient: 30 minutes  Past Psychiatric History: see admission H and P  Past Medical History: History reviewed. No pertinent past medical history.  Past Surgical History  Procedure Laterality Date  . No past surgeries     Family History:  Family History  Problem Relation Age of Onset  . CAD Neg Hx    Family Psychiatric  History: see admission H and P Social History:  History  Alcohol Use No     History  Drug Use  . Yes    Social History   Social History  . Marital Status: Single    Spouse Name: N/A  . Number of Children: N/A  . Years of Education: N/A   Social History Main Topics  . Smoking status: Current Some Day Smoker  . Smokeless tobacco: None  . Alcohol Use: No  . Drug Use: Yes  . Sexual Activity: Not Asked   Other Topics  Concern  . None   Social History Narrative   Additional Social History:    Pain Medications: none Prescriptions: none Over the Counter: none History of alcohol / drug use?: Yes                    Sleep: Fair  Appetite:  Fair  Current Medications: Current Facility-Administered Medications  Medication Dose Route Frequency Provider Last Rate Last Dose  . acetaminophen (TYLENOL) tablet 650 mg  650 mg Oral Q6H PRN Beau Fanny, FNP      . alum & mag hydroxide-simeth (MAALOX/MYLANTA) 200-200-20 MG/5ML suspension 30 mL  30 mL Oral Q4H PRN Beau Fanny, FNP      . carvedilol (COREG) tablet 6.25 mg  6.25 mg Oral BID WC Beau Fanny, FNP   6.25 mg at 06/18/15 0809  . citalopram (CELEXA) tablet 10 mg  10 mg Oral Daily Beau Fanny, FNP   10 mg at 06/18/15 0809  . clonazePAM (KLONOPIN) tablet 0.5 mg  0.5 mg Oral BID PRN Beau Fanny, FNP   0.5 mg at 06/15/15 2007  . hydrOXYzine (ATARAX/VISTARIL) tablet 25 mg  25 mg Oral BID BM PRN Cleotis Nipper, MD      . mirtazapine (REMERON) tablet 15 mg  15 mg Oral QHS Rachael Fee, MD      . nicotine (NICODERM CQ - dosed in mg/24 hours) patch 21 mg  21 mg Transdermal Q0600 Madie Reno  Jorja LoaA Said Rueb, MD   21 mg at 06/18/15 [id number]  . QUEtiapine (SEROQUEL XR) 24 hr tablet 50 mg  50 mg Oral Daily Rachael FeeIrving A Eufelia Veno, MD   50 mg at 06/18/15 [id number]    Lab Results: No results found for this or any previous visit (from the past 48 hour(s)).  Physical Findings: AIMS: Facial and Oral Movements Muscles of Facial Expression: None, normal Lips and Perioral Area: None, normal Jaw: None, normal Tongue: None, normal,Extremity Movements Upper (arms, wrists, hands, fingers): None, normal Lower (legs, knees, ankles, toes): None, normal, Trunk Movements Neck, shoulders, hips: None, normal, Overall Severity Severity of abnormal movements (highest score from questions above): None, normal Incapacitation due to abnormal movements: None, normal Patient's awareness of  abnormal movements (rate only patient's report): No Awareness, Dental Status Current problems with teeth and/or dentures?: No Does patient usually wear dentures?: No  CIWA:    COWS:     Musculoskeletal: Strength & Muscle Tone: within normal limits Gait & Station: normal Patient leans: normal  Psychiatric Specialty Exam: Review of Systems  Constitutional: Positive for malaise/fatigue.  HENT: Negative.   Eyes: Negative.   Respiratory: Positive for cough.   Cardiovascular: Positive for chest pain.       Worst when taking a deep breath  Gastrointestinal: Negative.   Genitourinary: Negative.   Musculoskeletal: Negative.   Skin: Negative.   Neurological: Positive for weakness.  Endo/Heme/Allergies: Negative.   Psychiatric/Behavioral: Positive for depression and substance abuse.    Blood pressure 112/68, pulse 88, temperature 97.8 F (36.6 C), temperature source Oral, resp. rate 16, height 5\' 6"  (1.676 m), weight 56.7 kg (125 lb), SpO2 98 %.Body mass index is 20.19 kg/(m^2).  General Appearance: Fairly Groomed  Patent attorneyye Contact::  Fair  Speech:  Clear and Coherent and Slow  Volume:  Decreased  Mood:  Anxious and Depressed  Affect:  Restricted  Thought Process:  Coherent and Goal Directed  Orientation:  Full (Time, Place, and Person)  Thought Content:  symptoms events worries concerns  Suicidal Thoughts:  No  Homicidal Thoughts:  No  Memory:  Immediate;   Fair Recent;   Fair Remote;   Fair  Judgement:  Fair  Insight:  Present and Shallow  Psychomotor Activity:  Decreased  Concentration:  Fair  Recall:  [employer]Fair  Fund of Knowledge:Fair  Language: Fair  Akathisia:  No  Handed:  Right  AIMS (if indicated):     Assets:  Desire for Improvement Housing Social Support  ADL's:  Intact  Cognition: WNL  Sleep:  Number of Hours: 7   Treatment Plan Summary: Daily contact with patient to assess and evaluate symptoms and progress in treatment and Medication management  Supportive  approach/coping skills Meth dependence work a relapse prevention plan Depression; continue to work with the Celexa, increase to 20 mg Insomnia; will increase the Remeron to 15 mg HS Anxiety/mood instability; will continue to work with the Seroquel XR 50 mg in AM Chest pain; evaluated by the NP will order chest X ray Explore residential treatment options Brittiany Wiehe A 06/18/2015, 3:59 PM

## 2015-06-18 NOTE — [facility] Group Notes (Signed)
[facility] LCSW Aftercare Discharge Planning Group Note  06/18/2015  8:45 AM  Participation Quality: Did Not Attend. Patient invited to participate but declined.  Tamu Golz, MSW, LCSWA Clinical Social Worker [facility] [phone]    

## 2015-06-18 NOTE — Progress Notes (Signed)
Patient remains flat, depressed though slightly less anxious today. Rates depression at a 5/10, hopelessness at a 2/10 and anxiety at a 2/10. He remains cautious and somewhat ambivalent. When asked about unsafe thoughts he responds with a slight smile, "not really." When this writer asked for clarification patient acknowledged that he would not disclose to staff if he was having SI. (He does deny HI. Patient offered support, encouraged to be open with staff so that he would receive optimal care. Medicated per orders. No prn's requested or required. Patient verbally contracts for safety and remains safe on unit. Will continue to monitor closely. Lawrence MarseillesFriedman, Graham Hyun Eakes

## 2015-06-18 NOTE — Plan of Care (Signed)
Problem: Diagnosis: Increased Risk For Suicide Attempt Goal: STG-Patient will be able to identify reason for suicidal STG-Patient will be able to identify reason for suicidal ideation  Outcome: Not Progressing Patient unable or unwilling to verbalize reasons for SI.Guarded.  Problem: Alteration in mood; excessive anxiety as evidenced by: Goal: STG-Pt will report an absence of self-harm thoughts/actions (Patient will report an absence of self-harm thoughts or actions)  Outcome: Not Progressing Patient denies SI however when asked if he would disclose those thoughts he responded, "no, I wouldn't tell you.'

## 2015-06-18 NOTE — Significant Event (Cosign Needed)
Patient with reported CP earlier this evening. Currently asymptomatic. Patient with new onset non ischemic cardiomyopathy with depressed LVSF per recent 2DE. EKG completed on 18 Jun 2015 @ 21:39:58 to r/o ischemia or acute infarct. EKG impression sinus bradycardia with right axis deviation. Voltage criteria suggestive of LVH. QTC 446 ms, No acute ST and or T wave abn.No further evaluation of prior chest pain concerns warranted clinically at this time. Continue with current medication regimen.  Supervising Physician Dub MikesLugo MD

## 2015-06-19 NOTE — [facility] Group Notes (Signed)
[facility] LCSW Group Therapy 06/19/2015  1:15 PM   Type of Therapy: Group Therapy  Participation Level: Did Not Attend. Patient invited to participate but declined.   Chasitee Zenker, MSW, LCSWA Clinical Social Worker [facility] [phone]   

## 2015-06-19 NOTE — Progress Notes (Signed)
D-  Patient has been irritable this shift. Upon waking the patient for medications the patient snapped "you are extremely rude and intrusive. The patient bought the policy to the medication window and stated.  The policy says that you are not supposed to wake me unless its a medical necessity where writer explained that medications were a part of his treatment and a medical necessity.  Patient also stated that he had a right to refuse medications but did not refuse them.  Patient refused groups and refused any further interaction with Clinical research associatewriter.   A- assessed patient for safety, offered medications as prescribed, encouraged patient to attend groups. Assessed for effectiveness of medications.   R-  Patient did not have any improvement in his mood, patient remained isolative and irritable.  Patient was able to verbally contract for safety.

## 2015-06-19 NOTE — Progress Notes (Signed)
[facility] phone [phone].[facility] tomorrow Friday 06/20/2015 for echocardiogram.Nurse called and confirmed with [facility] that we will [facility] this [facility] Friday 06/20/2015. NP and charge nurse informed.[facility] and [facility] transport required.

## 2015-06-19 NOTE — Progress Notes (Signed)
D. Pt had been in room and in bed for much of the evening, very minimal interaction or participation. Pt spoke about he did not want to engage with with anyone this evening and wanted to be in his room and be left alone. Pt did receive EKG and did receive medications without incident. A. Support provided. R. Safety maintained, will continue to monitor.

## 2015-06-19 NOTE — Tx Team (Signed)
Interdisciplinary Treatment Plan Update (Adult) Date: 06/19/2015   Time Reviewed: 9:30 AM  Progress in Treatment: Attending groups: Minimally Participating in groups: Minimally Taking medication as prescribed: Yes Tolerating medication: Yes Family/Significant other contact made: No, patient has declined collateral contact Patient understands diagnosis: Yes Discussing patient identified problems/goals with staff: Yes Medical problems stabilized or resolved: Yes Denies suicidal/homicidal ideation: Yes Issues/concerns per patient self-inventory: Yes Other:  New problem(s) identified: N/A  Discharge Plan or Barriers: Interested in [facility] or [facility] (listing provided and patient encouraged to call). Patient appears to have minimal motivation to stop using as evidenced by his expression of ambivalence regarding recovery, minimizing his use and it's negative consequences in his life, and minimal participation in groups    Reason for Continuation of Hospitalization:  Depression Anxiety Medication Stabilization   Comments: N/A  Estimated length of stay: 2-3 days   Patient is a 19 year old Caucasian Male with a diagnosis of Amphetamine-type Use Disorder. Patient lives in [place] alone. Pt reports coming to the hospital after overdosing on tylenol and Klonopin. Pt states that he is unsure if it was a suicide attempt or not, because he was high when he did it. Pt presents guarded, evasive and questioned why CSW had to ask all of these questions. Pt states that he uses crystal meth regularly, with no plans to stop using. Pt states that he was referred to a psychiatrist at last admission but failed to follow up because he got high instead. Patient will benefit from crisis stabilization, medication evaluation, group therapy and psycho education in addition to case management for discharge planning. Discharge Process and Patient Expectations information sheet signed by  patient, witnessed by writer and inserted in patient's shadow chart.    Review of initial/current patient goals per problem list:  1. Goal(s): Patient will participate in aftercare plan   Met: Yes   Target date: 3-5 days post admission date   As evidenced by: Patient will participate within aftercare plan AEB aftercare provider and housing plan at discharge being identified.  10/24: Goal progressing: Interested in [facility] or [facility] (listing provided and patient encouraged to call). Patient appears to have minimal motivation to stop using as evidenced by his expression of ambivalence regarding recovery and minimal participation in groups  10/27: Goal met: Patient has [facility] screening scheduled for 10/31.       2. Goal (s): Patient will exhibit decreased depressive symptoms and suicidal ideations.   Met: Goal Progressing   Target date: 3-5 days post admission date   As evidenced by: Patient will utilize self rating of depression at 3 or below and demonstrate decreased signs of depression or be deemed stable for discharge by MD.  10/24: Goal Progressing.   10/27: Goal Progressing. Patient interacts minimally with milieu and programming.       3. Goal(s): Patient will demonstrate decreased signs and symptoms of anxiety.   Met: Goal Progressing   Target date: 3-5 days post admission date   As evidenced by: Patient will utilize self rating of anxiety at 3 or below and demonstrated decreased signs of anxiety, or be deemed stable for discharge by MD  10/24: Goal Progressing.  10/27: Goal Progressing. Patient interacts minimally with milieu and programming.       4. Goal(s): Patient will demonstrate decreased signs of withdrawal due to substance abuse   Met: Yes   Target date: 3-5 days post admission date   As evidenced by: Patient will produce a CIWA/COWS  score of 0, have stable vitals signs, and no symptoms of withdrawal  10/24: Goal met:  No withdrawal symptoms reported at this time per medical chart.       Attendees: Patient:    Family:    Physician: Dr. Parke Poisson; Dr. Sabra Heck 06/19/2015 9:30 AM  Nursing: Lynda Rainwater, [place], [place], [place] 06/19/2015 9:30 AM  Clinical Social Worker: Tilden Fossa,  [place] 06/19/2015 9:30 AM  Other: Louie Bun Smart LCSWA  06/19/2015 9:30 AM  Other: Lucinda Dell, [place] Sessions Liaison 06/19/2015 9:30 AM  Other: Lars Pinks, Case Manager 06/19/2015 9:30 AM  Other: Earleen Newport, NP 06/19/2015 9:30 AM  Other:    Other:       Scribe for Treatment Team:  Tilden Fossa, MSW, [place] [phone]

## 2015-06-19 NOTE — Progress Notes (Signed)
[facility] MD Progress Note  06/19/2015 4:11 PM Johnny Fuller  MRN:  [id number] Subjective:  Continues to endorse he is trying to deal with a lot of "tangled feelings". States he has not have to face them before. Admits he is dealing with a lot of shame and guilt. He is still not that happy that he did not die. Still endorsing a lot of anxiety. He had to leave the cafeteria as he felt he was about to have a panic attack. Admits to cravings for meth and missing the way it makes him feel Principal Problem: Substance induced mood disorder (HCC) Diagnosis:   Patient Active Problem List   Diagnosis Date Noted  . Substance induced mood disorder (HCC) [F19.94] 06/16/2015  . 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 MDD (major depressive disorder), recurrent severe, without psychosis (HCC) [F33.2] 06/14/2015  . 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 Hypokalemia [E87.6] 06/13/2015  . Intentional benzodiazepine overdose (HCC) [T42.4X2A] 06/12/2015  . HCAP (healthcare-associated pneumonia) [J18.9] 06/12/2015  . Encephalopathy [G93.40]   . Acute respiratory failure (HCC) [J96.00] 06/09/2015   Total Time spent with patient: 30 minutes  Past Psychiatric History: see admission H and P  Past Medical History: History reviewed. No pertinent past medical history.  Past Surgical History  Procedure Laterality Date  . No past surgeries     Family History:  Family History  Problem Relation Age of Onset  . CAD Neg Hx    Family Psychiatric  History: see admission H and P Social History:  History  Alcohol Use No     History  Drug Use  . Yes    Social History   Social History  . Marital Status: Single    Spouse Name: N/A  . Number of Children: N/A  . Years of Education: N/A   Social History Main Topics  . Smoking status: Current Some Day Smoker  . Smokeless tobacco: None  . Alcohol Use: No  . Drug Use: Yes  . Sexual Activity: Not Asked   Other Topics Concern  . None    Social History Narrative   Additional Social History:    Pain Medications: none Prescriptions: none Over the Counter: none History of alcohol / drug use?: Yes                    Sleep: Fair  Appetite:  Poor  Current Medications: Current Facility-Administered Medications  Medication Dose Route Frequency Provider Last Rate Last Dose  . acetaminophen (TYLENOL) tablet 650 mg  650 mg Oral Q6H PRN Beau Fanny, FNP      . alum & mag hydroxide-simeth (MAALOX/MYLANTA) 200-200-20 MG/5ML suspension 30 mL  30 mL Oral Q4H PRN Beau Fanny, FNP      . carvedilol (COREG) tablet 6.25 mg  6.25 mg Oral BID WC Beau Fanny, FNP   6.25 mg at 06/19/15 0834  . citalopram (CELEXA) tablet 20 mg  20 mg Oral Daily Rachael Fee, MD   20 mg at 06/19/15 6213  . hydrOXYzine (ATARAX/VISTARIL) tablet 25 mg  25 mg Oral BID BM PRN Cleotis Nipper, MD      . ibuprofen (ADVIL,MOTRIN) tablet 600 mg  600 mg Oral Q6H PRN Rachael Fee, MD   600 mg at 06/18/15 1838  . mirtazapine (REMERON) tablet 15 mg  15 mg Oral QHS Rachael Fee, MD   15 mg at 06/18/15 2225  . nicotine (NICODERM CQ - dosed in mg/24 hours) patch 21 mg  21 mg Transdermal Q0600 [name], MD   21 mg at 06/19/15 0834  . QUEtiapine (SEROQUEL XR) 24 hr tablet 50 mg  50 mg Oral Daily [name], MD   50 mg at 06/19/15 0834  . QUEtiapine (SEROQUEL) tablet 25 mg  25 mg Oral Q6H PRN [name], MD   25 mg at 06/18/15 1840    Lab Results: No results found for this or any previous visit (from the past 48 hour(s)).  Physical Findings: AIMS: Facial and Oral Movements Muscles of Facial Expression: None, normal Lips and Perioral Area: None, normal Jaw: None, normal Tongue: None, normal,Extremity Movements Upper (arms, wrists, hands, fingers): None, normal Lower (legs, knees, ankles, toes): None, normal, Trunk Movements Neck, shoulders, hips: None, normal, Overall Severity Severity of abnormal movements (highest score from questions  above): None, normal Incapacitation due to abnormal movements: None, normal Patient's awareness of abnormal movements (rate only patient's report): No Awareness, Dental Status Current problems with teeth and/or dentures?: No Does patient usually wear dentures?: No  CIWA:    COWS:     Musculoskeletal: Strength & Muscle Tone: within normal limits Gait & Station: normal Patient leans: normal  Psychiatric Specialty Exam: Review of Systems  Constitutional: Positive for malaise/fatigue.  HENT: Negative.   Eyes: Negative.   Respiratory: Negative.   Cardiovascular: Negative.   Gastrointestinal: Negative.   Genitourinary: Negative.   Musculoskeletal:       Left side pain  Skin: Negative.   Neurological: Negative.   Endo/Heme/Allergies: Negative.   Psychiatric/Behavioral: Positive for depression and substance abuse. The patient is nervous/anxious.     Blood pressure 120/52, pulse 88, temperature 98.2 F (36.8 C), temperature source Oral, resp. rate 16, height 5\' 6"  (1.676 m), weight 56.7 kg (125 lb), SpO2 98 %.Body mass index is 20.19 kg/(m^2).  General Appearance: Fairly Groomed  Patent attorneyye Contact::  Minimal  Speech:  Clear and Coherent and Slow  Volume:  Decreased  Mood:  Anxious and Depressed  Affect:  Restricted  Thought Process:  Coherent and Goal Directed  Orientation:  Full (Time, Place, and Person)  Thought Content:  symptoms events worries concerns  Suicidal Thoughts:  No  Homicidal Thoughts:  No  Memory:  Immediate;   Fair Recent;   Fair Remote;   Fair  Judgement:  Fair  Insight:  Present and Shallow  Psychomotor Activity:  Decreased  Concentration:  Fair  Recall:  FiservFair  Fund of Knowledge:Fair  Language: Fair  Akathisia:  No  Handed:  Right  AIMS (if indicated):     Assets:  Social Support  ADL's:  Intact  Cognition: WNL  Sleep:  Number of Hours: 6.5   Treatment Plan Summary: Daily contact with patient to assess and evaluate symptoms and progress in treatment  and Medication management Supportive approach/coping skills Methamphetamine dependence; continue to work a relapse prevention plan Depression: will work with the Celexa 20 mg daily Insomnia; will work with the Remeron 15 hoping it will also help with the anxiety and appetite Anxiety/panic will continue the Seroquel XR 50 mg in AM and use the Seroquel 25 mg PRN Will work with CBT/mindfulness Cardiomyopathy; will repeat the echocardiogram tomorrow AM.as recommended by internal medicine [facility]; he has a high risk for relapse. He has already caused some damage to his heart by OD while under influence,  having very irrational thoughts like OD and try to see how it feels being dead. Will work with CBT/mindfulness Johnny Fuller  A 06/19/2015, 4:11 PM

## 2015-06-19 NOTE — [facility] Group Notes (Signed)
The focus of this group is to educate the patient on the purpose and policies of crisis stabilization and provide a format to answer questions about their admission.  The group details unit policies and expectations of patients while admitted.  Patient did not attend 0900 nurse education orientation group this morning.  Patient stayed in bed.   

## 2015-06-20 ENCOUNTER — Ambulatory Visit (HOSPITAL_COMMUNITY)
Admit: 2015-06-20 | Discharge: 2015-06-20 | Disposition: A | Discharge status: Home / Self Care | Payer: Medicaid Other | Source: Ambulatory Visit | Attending: Registered Nurse | Admitting: Registered Nurse

## 2015-06-20 DIAGNOSIS — G9340 Encephalopathy, unspecified: Secondary | ICD-10-CM | POA: Insufficient documentation

## 2015-06-20 DIAGNOSIS — T424X2A Poisoning by benzodiazepines, intentional self-harm, initial encounter: Secondary | ICD-10-CM | POA: Diagnosis not present

## 2015-06-20 DIAGNOSIS — I071 Rheumatic tricuspid insufficiency: Secondary | ICD-10-CM | POA: Insufficient documentation

## 2015-06-20 DIAGNOSIS — F1994 Other psychoactive substance use, unspecified with psychoactive substance-induced mood disorder: Secondary | ICD-10-CM

## 2015-06-20 DIAGNOSIS — I519 Heart disease, unspecified: Secondary | ICD-10-CM | POA: Insufficient documentation

## 2015-06-20 NOTE — [facility] Group Notes (Signed)
[facility]   06/20/2015 9:49 AM  Participation Quality:  Invited/DID NOT ATTEND. Pt chose to remain in bed.   Smart, American FinancialHeather LCSWA

## 2015-06-20 NOTE — Tx Team (Signed)
Interdisciplinary Treatment Plan Update (Adult) Date: 06/20/2015   Time Reviewed: 9:30 AM  Progress in Treatment: Attending groups: No. Participating in groups: No.  Taking medication as prescribed: Yes Tolerating medication: Yes Family/Significant other contact made: No, patient has declined collateral contact Patient understands diagnosis: Yes Discussing patient identified problems/goals with staff: Yes Medical problems stabilized or resolved: Yes Denies suicidal/homicidal ideation: Yes Issues/concerns per patient self-inventory: Yes Other:  New problem(s) identified: N/A  Discharge Plan or Barriers: Interested in [facility] or [facility] (listing provided and patient encouraged to call). Patient appears to have minimal motivation to stop using as evidenced by his expression of ambivalence regarding recovery, minimizing his use and it's negative consequences in his life, and minimal participation in groups  10/28: Pt continues to remain isolative in room/refusing to attend groups. Pt has screening at [facility] residential on Monday, 06/23/15. Pt given [facility].     Reason for Continuation of Hospitalization:  Medication management   Comments: N/A  Estimated length of stay: 3 days (d/c scheduled for 7:15AM on Monday morning 06/23/15, as he has [place] residential screening. Family member will need to transport pt to facility. Pt has not followed up with CSW regarding mode of transport and will not allow CSW to contact family.   Patient is a 19 year old Caucasian Male with a diagnosis of Amphetamine-type Use Disorder. Patient lives in [place] alone. Pt reports coming to the hospital after overdosing on tylenol and Klonopin. Pt states that he is unsure if it was a suicide attempt or not, because he was high when he did it. Pt presents guarded, evasive and questioned why CSW had to ask all of these questions. Pt states that he uses crystal meth regularly, with  no plans to stop using. Pt states that he was referred to a psychiatrist at last admission but failed to follow up because he got high instead. Patient will benefit from crisis stabilization, medication evaluation, group therapy and psycho education in addition to case management for discharge planning. Discharge Process and Patient Expectations information sheet signed by patient, witnessed by writer and inserted in patient's shadow chart.    Review of initial/current patient goals per problem list:  1. Goal(s): Patient will participate in aftercare plan   Met: Yes   Target date: 3-5 days post admission date   As evidenced by: Patient will participate within aftercare plan AEB aftercare provider and housing plan at discharge being identified.  10/24: Goal progressing: Interested in [facility] or [facility] (listing provided and patient encouraged to call). Patient appears to have minimal motivation to stop using as evidenced by his expression of ambivalence regarding recovery and minimal participation in groups  10/27: Goal met: Patient has [facility] screening scheduled for 10/31.    2. Goal (s): Patient will exhibit decreased depressive symptoms and suicidal ideations.   Met: Yes   Target date: 3-5 days post admission date   As evidenced by: Patient will utilize self rating of depression at 3 or below and demonstrate decreased signs of depression or be deemed stable for discharge by MD.  10/24: Goal Progressing.   10/27: Goal Progressing. Patient interacts minimally with milieu and programming.   108/28: Goal adequate for discharge. Pt reports minimal depression/no SI. Pt appears to present at his baseline.    3. Goal(s): Patient will demonstrate decreased signs and symptoms of anxiety.   Met: Goal Met   Target date: 3-5 days post admission date   As evidenced by: Patient will  utilize self rating of anxiety at 3 or below and demonstrated decreased signs of  anxiety, or be deemed stable for discharge by MD  10/24: Goal Progressing.  10/27: Goal Progressing. Patient interacts minimally with milieu and programming.   10/28: Goal met. Pt denies anxiety today.     4. Goal(s): Patient will demonstrate decreased signs of withdrawal due to substance abuse   Met: Yes   Target date: 3-5 days post admission date   As evidenced by: Patient will produce a CIWA/COWS score of 0, have stable vitals signs, and no symptoms of withdrawal  10/24: Goal met: No withdrawal symptoms reported at this time per medical chart.   Attendees: Patient:    Family:    Physician: Dr. Parke Poisson; Dr. Sabra Heck 06/20/2015 2:58 PM   Nursing: Elio Forget RN 06/20/2015 2:58 PM   Clinical Social Worker: Tilden Fossa,  Wilmer 06/20/2015 2:58 PM   Other: Peri Maris, Nira Conn Smart LCSWA  06/20/2015 2:58 PM   Other: Lucinda Dell, Beverly Sessions Liaison 06/20/2015 2:58 PM   Other: Lars Pinks, Case Manager 06/20/2015 2:58 PM   Other: Earleen Newport, NP 06/20/2015 2:58 PM   Other:    Other:     Scribe for Treatment Team:  Maxie Better, [place] Social Worker 06/20/2015 2:59 PM

## 2015-06-20 NOTE — Progress Notes (Signed)
NSG 7a-7p shift:   D:  Pt. Has been guarded, blunted, and depressed this shift.   He stated that he had been told that the drugs he had been doing had "messed up" his heart, but seemed ambivalent about whether or not he would be able to stop doing them entirely.  He stated that although he did do cocaine, that he believed that it was methamphetamines that did the most damage.  Pt sent for his echocardiogram without incident.   Pt's Goal today is to interact more with his peers and staff.   A: Support, education, and encouragement provided as needed.  Level 3 checks continued for safety.  R: Pt. moderately receptive to intervention/s, but does not seem very vested in treatment at this time.  Safety maintained.  Joaquin MusicMary Brett Darko, RN

## 2015-06-20 NOTE — Progress Notes (Signed)
  Echocardiogram 2D Echocardiogram has been performed.  Johnny SavoyCasey [name] 06/20/2015, 11:18 AM

## 2015-06-20 NOTE — Progress Notes (Signed)
Patient did not attend the evening speaker AA meeting. Pt entered the meeting but returned to his room soon after meeting began.

## 2015-06-20 NOTE — Progress Notes (Signed)
Patient ID: JOMARION MISH, male   DOB: 06/01/1996, 19 y.o.   MRN: [id number] [facility] MD Progress Note  06/20/2015 5:45 PM DAHLTON HINDE  MRN:  [id number] Subjective:  Patient reports partial improvement compared to prior , but reports chronic depression, a tendency to anhedonia and ruminations about negative aspects of his life, and admits he had feelings of resentment and disappointment that his suicide attempt was not successful and that " the Doctors did not let me die ". This being said, he does state he is better, and states he has decided to " give living a chance ", and to go to Rehab for ongoing work on early recovery. He does state he feels his methamphetamine dependence significantly contributed to his depression and suicide attempt. Objective : Case discussed with treatment team and patient seen by me . Patient is a 19 year old single male, history of methamphetamine dependence, history of depression, recent severe /serious suicide attempt by overdose resulting in need for ICU setting care . At this time partially improved, but still endorsing chronic depression, sadness. Affect is reactive and does smile at times appropriately. Currently denies any psychotic symptoms and does not appear internally preoccupied .  No medication side effects endorsed .  As noted , patient currently wanting to go to an residential rehab setting after discharge and as per team, the plan is to go to [facility] residential rehab setting after discharge. Principal Problem: Substance induced mood disorder (HCC) Diagnosis:   Patient Active Problem List   Diagnosis Date Noted  . Substance induced mood disorder (HCC) [F19.94] 06/16/2015  . 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 MDD (major depressive disorder), recurrent severe, without psychosis (HCC) [F33.2] 06/14/2015  . 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 Hypokalemia [E87.6] 06/13/2015  . Intentional benzodiazepine overdose (HCC) [T42.4X2A]  06/12/2015  . HCAP (healthcare-associated pneumonia) [J18.9] 06/12/2015  . Encephalopathy [G93.40]   . Acute respiratory failure (HCC) [J96.00] 06/09/2015   Total Time spent with patient:  25 minutes   Past Psychiatric History: see admission H and P  Past Medical History: History reviewed. No pertinent past medical history.  Past Surgical History  Procedure Laterality Date  . No past surgeries     Family History:  Family History  Problem Relation Age of Onset  . CAD Neg Hx    Family Psychiatric  History: see admission H and P Social History:  History  Alcohol Use No     History  Drug Use  . Yes    Social History   Social History  . Marital Status: Single    Spouse Name: N/A  . Number of Children: N/A  . Years of Education: N/A   Social History Main Topics  . Smoking status: Current Some Day Smoker  . Smokeless tobacco: None  . Alcohol Use: No  . Drug Use: Yes  . Sexual Activity: Not Asked   Other Topics Concern  . None   Social History Narrative   Additional Social History:    Pain Medications: none Prescriptions: none Over the Counter: none History of alcohol / drug use?: Yes  Sleep:  Improved   Appetite:   Fair   Current Medications: Current Facility-Administered Medications  Medication Dose Route Frequency Provider Last Rate Last Dose  . acetaminophen (TYLENOL) tablet 650 mg  650 mg Oral Q6H PRN Beau Fanny, FNP      . alum & mag hydroxide-simeth (MAALOX/MYLANTA) 200-200-20 MG/5ML suspension 30 mL  30 mL Oral Q4H PRN Jonny Ruiz  C Withrow, FNP      . carvedilol (COREG) tablet 6.25 mg  6.25 mg Oral BID WC Beau Fanny, FNP   6.25 mg at 06/20/15 1635  . citalopram (CELEXA) tablet 20 mg  20 mg Oral Daily Rachael Fee, MD   20 mg at 06/20/15 0830  . hydrOXYzine (ATARAX/VISTARIL) tablet 25 mg  25 mg Oral BID BM PRN Cleotis Nipper, MD      . ibuprofen (ADVIL,MOTRIN) tablet 600 mg  600 mg Oral Q6H PRN Rachael Fee, MD   600 mg at 06/20/15 1635  .  mirtazapine (REMERON) tablet 15 mg  15 mg Oral QHS Rachael Fee, MD   15 mg at 06/19/15 2144  . nicotine (NICODERM CQ - dosed in mg/24 hours) patch 21 mg  21 mg Transdermal Q0600 Rachael Fee, MD   21 mg at 06/20/15 0834  . QUEtiapine (SEROQUEL XR) 24 hr tablet 50 mg  50 mg Oral Daily Rachael Fee, MD   50 mg at 06/20/15 0830  . QUEtiapine (SEROQUEL) tablet 25 mg  25 mg Oral Q6H PRN Rachael Fee, MD   25 mg at 06/20/15 1635    Lab Results: No results found for this or any previous visit (from the past 48 hour(s)).  Physical Findings: AIMS: Facial and Oral Movements Muscles of Facial Expression: None, normal Lips and Perioral Area: None, normal Jaw: None, normal Tongue: None, normal,Extremity Movements Upper (arms, wrists, hands, fingers): None, normal Lower (legs, knees, ankles, toes): None, normal, Trunk Movements Neck, shoulders, hips: None, normal, Overall Severity Severity of abnormal movements (highest score from questions above): None, normal Incapacitation due to abnormal movements: None, normal Patient's awareness of abnormal movements (rate only patient's report): No Awareness, Dental Status Current problems with teeth and/or dentures?: No Does patient usually wear dentures?: No  CIWA:    COWS:     Musculoskeletal: Strength & Muscle Tone: within normal limits Gait & Station: normal Patient leans: normal  Psychiatric Specialty Exam: Review of Systems  Constitutional: Positive for malaise/fatigue.  HENT: Negative.   Eyes: Negative.   Respiratory: Negative.   Cardiovascular: Negative.   Gastrointestinal: Negative.   Genitourinary: Negative.   Musculoskeletal:       Left side pain  Skin: Negative.   Neurological: Negative.   Endo/Heme/Allergies: Negative.   Psychiatric/Behavioral: Positive for depression and substance abuse. The patient is nervous/anxious.   at this time does not endorse chest pain or shortness of breath at rest/room air   Blood pressure 140/83,  pulse 92, temperature 98.2 F (36.8 C), temperature source Oral, resp. rate 16, height  (1.676 m), weight 125 lb (56.7 kg), SpO2 98 %.Body mass index is 20.19 kg/(m^2).  General Appearance: Fairly Groomed  Patent attorney::  Good   Speech:  normal  Volume:  Decreased  Mood:  Depressed and but states he is feeling better than upon admission  Affect:  Constricted but reactive, smiles at times appropriately  Thought Process:  Coherent and Goal Directed  Orientation:  Full (Time, Place, and Person)  Thought Content:   Denies hallucinations, no delusions, does not appear internally preoccupied at this time  Suicidal Thoughts:  No currently denies thoughts of suicide , denies any plan or intent to hurt self and contracts for safety on unit   Homicidal Thoughts:  No  Memory:  Recent and remote grossly intact  Judgement:  Improving   Insight:   improving  Psychomotor Activity:  Decreased  Concentration:  Good  Recall:  Dudley MajorGood  Fund of Knowledge:Good  Language: Good  Akathisia:  No  Handed:  Right  AIMS (if indicated):     Assets:  Social Support  ADL's:  Intact  Cognition: WNL  Sleep:  Number of Hours: 6.5  Assessment - 19 year old  Man , history of methamphetamine dependence, history of depression, S/P severe suicide attempt by overdose. Report that he developed secondary cardipyopathy- at this time no shortness of breath, no chest pain ( at room air , at rest ). Had follow up Echocardiogram earlier today. Mood still depressed but improving and currently denies any ongoing SI. Wants to go to Rehab setting upon discharge .  Treatment Plan Summary: Daily contact with patient to assess and evaluate symptoms and progress in treatment and Medication management Supportive approach/coping skills Methamphetamine dependence; continue to work a relapse prevention plan Depression: continue Celexa 20 mgrs QDAY Insomnia; continue Remeron 15 mgrs QHS for depression and insomnia  Anxiety/panic will  continue the Seroquel XR 50 mg in AM and continue Seroquel 25 mgrs Q 6 hours PRN for anxiety as needed  [facility]hab setting focusing on recovery and relapse prevention efforts Encourage increased milieu, group participation to work on early recovery and relapse prevention efforts COBOS, FERNANDO 06/20/2015, 5:45 PM

## 2015-06-20 NOTE — Progress Notes (Signed)
D. Pt had been up and visible in milieu this evening, did attend evening group activity. Pt did endorse anxiety and having difficulties with it. Pt was seen interacting appropriately with peers in the milieu and received medications without incident. A. Support and encouragement provided. R. Safety maintained, will continue to monitor.

## 2015-06-20 NOTE — Progress Notes (Signed)
D.  Pt pleasant on approach, denies complaints at this time.  Pt did not attend evening AA group, remained in room.  Continues to report discomfort in left side.  Denies SI/HI/hallucinations at this time.  Interacting appropriately with peers on the unit.  A.  Support and encouragement offered  R. Pt remains safe on the unit, will continue to monitor.

## 2015-06-20 NOTE — [facility] Group Notes (Signed)
[facility] LCSW Group Therapy  06/20/2015 1:06 PM  Type of Therapy:  Group Therapy  Participation Level:  Did Not Attend-pt invited. Chose to remain in bed.   Modes of Intervention:  Confrontation, Discussion, Education, Exploration, Problem-solving, Rapport Building, Socialization and Support  Summary of Progress/Problems: Feelings around Relapse. Group members discussed the meaning of relapse and shared personal stories of relapse, how it affected them and others, and how they perceived themselves during this time. Group members were encouraged to identify triggers, warning signs and coping skills used when facing the possibility of relapse. Social supports were discussed and explored in detail. Post Acute Withdrawal Syndrome (handout provided) was introduced and examined. Pt's were encouraged to ask questions, talk about key points associated with PAWS, and process this information in terms of relapse prevention.   Smart, Mattis Featherly LCSWA  06/20/2015, 1:06 PM

## 2015-06-20 NOTE — Progress Notes (Addendum)
  [facility] :  Will you be returning to the same living situation after discharge:  No.[facility] Screening on Monday, 06/23/15 at 8am.  At discharge, do you have transportation home?: Yes,  family member should be transporting pt to facility. Pickup no later than 7:30AM. Pt has not been getting out of bed or to group in order to confirm this plan and has refused to consent to family contact. D/c On Monday 06/23/15 at 7:15/7:30AM.  Do you have the ability to pay for your medications: Yes,  [facility]  Release of information consent forms completed and submitted to medical records by CSW.  Patient to Follow up at: Follow-up Information    Follow up with [facility]  On 06/23/2015.   Why:  Screening appointment for possible admission on Monday Oct. 31st at 8 am. Please bring your [place] ID and insurance card. Bring 2 weeks of medications and clothing in case you are accepted. Call office if you need to reschedule.   Contact information:   [address] [place], [place] [address] Phone: [phone]      Patient denies SI/HI: Yes,  during group/self report.    Safety Planning and Suicide Prevention discussed: Yes,  SPE completed with pt,as he refused to consent to family contact. SPI pamphlet and mobile crisis information provided to pt.   Have you used any form of tobacco in the last 30 days? (Cigarettes, Smokeless Tobacco, Cigars, and/or Pipes): Yes  Has patient been referred to the Quitline?: Patient refused referral  Smart, Lebron QuamHeather LCSWA  06/20/2015, 3:05 PM

## 2015-06-21 DIAGNOSIS — F1510 Other stimulant abuse, uncomplicated: Secondary | ICD-10-CM

## 2015-06-21 MED ORDER — MIRTAZAPINE 15 MG PO TABS
15.0000 mg | ORAL_TABLET | Freq: Every day | ORAL | Status: DC
  Filled 2015-06-21: qty 14

## 2015-06-21 MED ORDER — QUETIAPINE FUMARATE ER 50 MG PO TB24
50.0000 mg | ORAL_TABLET | Freq: Every day | ORAL | Status: DC
  Filled 2015-06-21: qty 14

## 2015-06-21 MED ORDER — CITALOPRAM HYDROBROMIDE 20 MG PO TABS
20.0000 mg | ORAL_TABLET | Freq: Every day | ORAL | Status: DC
  Filled 2015-06-21: qty 14

## 2015-06-21 MED ORDER — HYDROXYZINE HCL 25 MG PO TABS
25.0000 mg | ORAL_TABLET | Freq: Two times a day (BID) | ORAL | Status: DC | PRN
  Filled 2015-06-21: qty 10

## 2015-06-21 MED ORDER — CARVEDILOL 6.25 MG PO TABS
6.2500 mg | ORAL_TABLET | Freq: Two times a day (BID) | ORAL | Status: DC
  Filled 2015-06-21: qty 28

## 2015-06-21 NOTE — [facility] Group Notes (Signed)
[facility] Group Notes:  (Nursing/MHT/Case Management/Adjunct)  Date:  06/21/2015  Time:  3:56 PM  Type of Therapy:  Psychoeducational Skills  Participation Level:  Did Not Attend  Participation Quality:  Did Not Attend  Affect:  Did Not Attend  Cognitive:  Did Not Attend  Insight:  None  Engagement in Group:  Did Not Attend  Modes of Intervention:  Did Not Attend  Summary of Progress/Problems: Pt did not attend patient self inventory group.  Jacquelyne BalintForrest, Tracer Gutridge Shanta 06/21/2015, 3:56 PM

## 2015-06-21 NOTE — Progress Notes (Signed)
Patient ID: Johnny Fuller, male   DOB: June 16, 1996, [age] y.o.   MRN: [id number]   D: Pt has been very flat and depressed on the unit today. Pt did not attend any groups nor did he engage in any treatment. Pt slept most of the day, he only got up for meds and food. Pt reported that his depression was a 4, his hopelessness was a 2, and his anxiety was a 4. Pt reported that he had no goal for today. Pt reported being negative SI/HI, no AH/VH noted. A: 15 min checks continued for patient safety. R: Pt safety maintained.

## 2015-06-21 NOTE — Progress Notes (Signed)
Patient ID: Johnny Fuller, male   DOB: Jan 04, 1996, 19 y.o.   MRN: [id number] [facility] MD Progress Note  06/21/2015 12:13 PM Johnny Fuller  MRN:  [id number] Subjective:  Patient reports partial improvement of chronic depression. History is suggestive of anhedonia and ruminations about negative aspects of his life. Today does not endorse resentment that he is living and  states he has decided to " give living a chance ", and to go to Rehab for ongoing work on early recovery. He does state he feels his methamphetamine dependence significantly contributed to his depression and suicide attempt. Objective : Case discussed with treatment team and patient seen by me . Patient is a 19 year old single male, history of methamphetamine dependence, history of depression, recent severe /serious suicide attempt by overdose resulting in need for ICU setting care . At this time partially improved, but still endorsing chronic depression, sadness. Affect is reactive and does smile at times appropriately. Currently denies any psychotic symptoms and does not appear internally preoccupied .  No medication side effects endorsed .  As noted , patient currently wanting to go to an residential rehab setting after discharge and as per team, the plan is to go to [facility] residential rehab setting after discharge. Principal Problem: Substance induced mood disorder (HCC) Diagnosis:   Patient Active Problem List   Diagnosis Date Noted  . Substance induced mood disorder (HCC) [F19.94] 06/16/2015  . 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 MDD (major depressive disorder), recurrent severe, without psychosis (HCC) [F33.2] 06/14/2015  . 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 Hypokalemia [E87.6] 06/13/2015  . Intentional benzodiazepine overdose (HCC) [T42.4X2A] 06/12/2015  . HCAP (healthcare-associated pneumonia) [J18.9] 06/12/2015  . Encephalopathy [G93.40]   . Acute respiratory failure (HCC) [J96.00] 06/09/2015    Total Time spent with patient:  25 minutes   Past Psychiatric History: see admission H and P  Past Medical History: History reviewed. No pertinent past medical history.  Past Surgical History  Procedure Laterality Date  . No past surgeries     Family History:  Family History  Problem Relation Age of Onset  . CAD Neg Hx    Family Psychiatric  History: see admission H and P Social History:  History  Alcohol Use No     History  Drug Use  . Yes    Social History   Social History  . Marital Status: Single    Spouse Name: N/A  . Number of Children: N/A  . Years of Education: N/A   Social History Main Topics  . Smoking status: Current Some Day Smoker  . Smokeless tobacco: None  . Alcohol Use: No  . Drug Use: Yes  . Sexual Activity: Not Asked   Other Topics Concern  . None   Social History Narrative   Additional Social History:    Pain Medications: none Prescriptions: none Over the Counter: none History of alcohol / drug use?: Yes  Sleep:  Improved   Appetite:   Fair   Current Medications: Current Facility-Administered Medications  Medication Dose Route Frequency Provider Last Rate Last Dose  . acetaminophen (TYLENOL) tablet 650 mg  650 mg Oral Q6H PRN Beau Fanny, FNP      . alum & mag hydroxide-simeth (MAALOX/MYLANTA) 200-200-20 MG/5ML suspension 30 mL  30 mL Oral Q4H PRN Beau Fanny, FNP      . carvedilol (COREG) tablet 6.25 mg  6.25 mg Oral BID WC Beau Fanny, FNP   6.25 mg at 06/21/15  [id number]  . citalopram (CELEXA) tablet 20 mg  20 mg Oral Daily Rachael FeeIrving A Lugo, MD   20 mg at 06/21/15 [id number]  . hydrOXYzine (ATARAX/VISTARIL) tablet 25 mg  25 mg Oral BID BM PRN Cleotis NipperSyed T Arfeen, MD      . ibuprofen (ADVIL,MOTRIN) tablet 600 mg  600 mg Oral Q6H PRN Rachael FeeIrving A Lugo, MD   600 mg at 06/21/15 0940  . mirtazapine (REMERON) tablet 15 mg  15 mg Oral QHS Rachael FeeIrving A Lugo, MD   15 mg at 06/20/15 2127  . nicotine (NICODERM CQ - dosed in mg/24 hours) patch 21 mg  21 mg  Transdermal Q0600 Rachael FeeIrving A Lugo, MD   21 mg at 06/21/15 0939  . QUEtiapine (SEROQUEL XR) 24 hr tablet 50 mg  50 mg Oral Daily Rachael FeeIrving A Lugo, MD   50 mg at 06/21/15 0939  . QUEtiapine (SEROQUEL) tablet 25 mg  25 mg Oral Q6H PRN Rachael FeeIrving A Lugo, MD   25 mg at 06/21/15 1108    Lab Results: No results found for this or any previous visit (from the past 48 hour(s)).  Physical Findings: AIMS: Facial and Oral Movements Muscles of Facial Expression: None, normal Lips and Perioral Area: None, normal Jaw: None, normal Tongue: None, normal,Extremity Movements Upper (arms, wrists, hands, fingers): None, normal Lower (legs, knees, ankles, toes): None, normal, Trunk Movements Neck, shoulders, hips: None, normal, Overall Severity Severity of abnormal movements (highest score from questions above): None, normal Incapacitation due to abnormal movements: None, normal Patient's awareness of abnormal movements (rate only patient's report): No Awareness, Dental Status Current problems with teeth and/or dentures?: No Does patient usually wear dentures?: No  CIWA:    COWS:     Musculoskeletal: Strength & Muscle Tone: within normal limits Gait & Station: normal Patient leans: normal  Psychiatric Specialty Exam: Review of Systems  Constitutional: Positive for malaise/fatigue.  Cardiovascular: Negative for chest pain.  Musculoskeletal:       Left side pain  Endo/Heme/Allergies: Negative.   Psychiatric/Behavioral: Positive for depression and substance abuse. The patient is nervous/anxious.   at this time does not endorse chest pain or shortness of breath at rest/room air   Blood pressure 121/61, pulse 94, temperature 97.8 F (36.6 C), temperature source Oral, resp. rate 16, height 5\' 6"  (1.676 m), weight 56.7 kg (125 lb), SpO2 98 %.Body mass index is 20.19 kg/(m^2).  General Appearance: Fairly Groomed  Patent attorneyye Contact::  Good   Speech:  normal  Volume:  Decreased  Mood:  Depressed and but states he is  feeling better than upon admission  Affect:  Constricted but reactive, smiles at times appropriately  Thought Process:  Coherent and Goal Directed  Orientation:  Full (Time, Place, and Person)  Thought Content:   Denies hallucinations, no delusions, does not appear internally preoccupied at this time  Suicidal Thoughts:  No currently denies thoughts of suicide , denies any plan or intent to hurt self and contracts for safety on unit   Homicidal Thoughts:  No  Memory:  Recent and remote grossly intact  Judgement:  Improving   Insight:   improving  Psychomotor Activity:  Decreased  Concentration:  Good  Recall:  Good  Fund of Knowledge:Good  Language: Good  Akathisia:  No  Handed:  Right  AIMS (if indicated):     Assets:  Social Support  ADL's:  Intact  Cognition: WNL  Sleep:  Number of Hours: 6.25  Assessment - 19 year old  Man , history of  methamphetamine dependence, history of depression, S/P severe suicide attempt by overdose. Report that he developed secondary cardipyopathy- at this time no shortness of breath, no chest pain ( at room air , at rest ). Mood still depressed but improving and currently denies any ongoing SI. Wants to go to Rehab setting upon discharge .  Treatment Plan Summary: Daily contact with patient to assess and evaluate symptoms and progress in treatment and Medication management Supportive approach/coping skills Methamphetamine dependence; continue to work a relapse prevention plan Depression: continue Celexa 20 mgrs QDAY Insomnia; continue Remeron 15 mgrs QHS for depression and insomnia  Anxiety/panic will continue the Seroquel XR 50 mg in AM and continue Seroquel 25 mgrs Q 6 hours PRN for anxiety as needed  Will facilitate admission to [facility] Monday AM for ongoing residential rehab setting focusing on recovery and relapse prevention efforts Encourage increased milieu, group participation to work on early recovery and relapse prevention efforts Johnny Fuller,  Johnny Fuller 06/21/2015, 12:13 PM

## 2015-06-21 NOTE — Progress Notes (Signed)
D.  Pt pleasant on approach, wishes to speak to the doctor tomorrow about increasing his Seroquel for anxiety, but also states he has a hard time getting up in the morning.  .  Pt did get up this evening to play cards with peers, minimal participation on unit.  Denies SI/HI/hallucinations at this time.  A.  Support and encouragement offered  R.  Pt remains safe on unit, will continue to monitor.

## 2015-06-21 NOTE — [facility] Group Notes (Signed)
[facility] Group Notes:  (Clinical Social Work)   06/21/2015     10:00-11:00AM  Summary of Progress/Problems:   In today's process group a decisional balance exercise was used to explore in depth the perceived benefits and costs of alcohol and drugs, as well as the  benefits and costs of replacing these with healthy coping skills.  Patients listed healthy and unhealthy coping techniques, determining with CSW guidance that unhealthy coping techniques work initially, but eventually become harmful.  Motivational Interviewing and the whiteboard were utilized for the exercises.  The patient expressed nothing really during the little time he was in group, arriving late.  He sat pleasantly smiling, but did not engage and then left early.  Type of Therapy:  Group Therapy - Process   Participation Level:  Minimal  Participation Quality:  Attentive  Affect:  Appropriate  Cognitive:  Alert  Insight:  Limited  Engagement in Therapy:  Limited  Modes of Intervention:  Education, Motivational Interviewing  Ambrose MantleMareida Grossman-Orr, LCSW 06/21/2015, 12:47 PM

## 2015-06-21 NOTE — Plan of Care (Signed)
Problem: Alteration in mood & ability to function due to Goal: STG-Patient will attend groups Outcome: Not Progressing Pt has attended minimal groups on the unit

## 2015-06-22 MED ORDER — QUETIAPINE FUMARATE 50 MG PO TABS: 50.0000 mg | ORAL_TABLET | ORAL | Status: DC

## 2015-06-22 MED ORDER — CARVEDILOL 6.25 MG PO TABS: 6.2500 mg | ORAL_TABLET | Freq: Two times a day (BID) | ORAL | Status: AC

## 2015-06-22 MED ORDER — MIRTAZAPINE 15 MG PO TABS: 15.0000 mg | ORAL_TABLET | Freq: Every day | ORAL | Status: DC

## 2015-06-22 MED ORDER — QUETIAPINE FUMARATE 50 MG PO TABS
50.0000 mg | ORAL_TABLET | Freq: Every day | ORAL | Status: DC
  Filled 2015-06-22: qty 1

## 2015-06-22 MED ORDER — HYDROXYZINE HCL 25 MG PO TABS: 25.0000 mg | ORAL_TABLET | Freq: Two times a day (BID) | ORAL | Status: DC | PRN

## 2015-06-22 MED ORDER — CITALOPRAM HYDROBROMIDE 20 MG PO TABS: 20.0000 mg | ORAL_TABLET | Freq: Every day | ORAL | Status: DC

## 2015-06-22 MED ORDER — QUETIAPINE FUMARATE 50 MG PO TABS
50.0000 mg | ORAL_TABLET | ORAL | Status: DC
  Administered 2015-06-22: 50 mg via ORAL
  Filled 2015-06-22 (×2): qty 1
  Filled 2015-06-22 (×2): qty 28
  Filled 2015-06-22: qty 1

## 2015-06-22 NOTE — Discharge Summary (Signed)
Physician Discharge Summary Note  Patient:  Johnny Fuller is an 19 y.o., male MRN:  [id number] DOB:  1996/05/19 Patient phone:  [phone] (home)  Patient address:   [address] [place] [address],  Total Time spent with patient: 30 minutes  Date of Admission:  06/14/2015 Date of Discharge: 06/23/2015  Reason for Admission:    Principal Problem: Substance induced mood disorder [facility]) Discharge Diagnoses: Patient Active Problem List   Diagnosis Date Noted  . Substance induced mood disorder (HCC) [F19.94] 06/16/2015  . 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 MDD (major depressive disorder), recurrent severe, without psychosis (HCC) [F33.2] 06/14/2015  . 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 Hypokalemia [E87.6] 06/13/2015  . Intentional benzodiazepine overdose (HCC) [T42.4X2A] 06/12/2015  . HCAP (healthcare-associated pneumonia) [J18.9] 06/12/2015  . Encephalopathy [G93.40]   . Acute respiratory failure (HCC) [J96.00] 06/09/2015    Musculoskeletal: Strength & Muscle Tone: within normal limits Gait & Station: normal Patient leans: N/A  Psychiatric Specialty Exam:  SEE MD SRA Physical Exam  Vitals reviewed. Psychiatric: His mood appears anxious. Thought content is not paranoid and not delusional. He does not exhibit a depressed mood. He expresses no homicidal and no suicidal ideation. He expresses no suicidal plans and no homicidal plans.    Review of Systems  Psychiatric/Behavioral: Positive for depression and suicidal ideas. The patient is not nervous/anxious.   All other systems reviewed and are negative.   Blood pressure 131/73, pulse 84, temperature 98.5 F (36.9 C), temperature source Oral, resp. rate 16, height  (1.676 m), weight 56.7 kg (125 lb), SpO2 98 %.Body mass index is 20.19 kg/(m^2).  Have you used any form of tobacco in the last 30 days? (Cigarettes, Smokeless Tobacco, Cigars, and/or Pipes): Yes  Has this patient used any form  of tobacco in the last 30 days? (Cigarettes, Smokeless Tobacco, Cigars, and/or Pipes) N/A  Past Medical History: History reviewed. No pertinent past medical history.  Past Surgical History  Procedure Laterality Date  . No past surgeries     Family History:  Family History  Problem Relation Age of Onset  . CAD Neg Hx    Social History:  History  Alcohol Use No     History  Drug Use  . Yes    Social History   Social History  . Marital Status: Single    Spouse Name: N/A  . Number of Children: N/A  . Years of Education: N/A   Social History Main Topics  . Smoking status: Current Some Day Smoker  . Smokeless tobacco: None  . Alcohol Use: No  . Drug Use: Yes  . Sexual Activity: Not Asked   Other Topics Concern  . None   Social History Narrative   Risk to Self: Is patient at risk for suicide?: Yes Risk to Others:   Prior Inpatient Therapy:   Prior Outpatient Therapy:    Level of Care:  OP  Hospital Course:  JELANI TRUEBA was admitted for Substance induced mood disorder (HCC) and crisis management.  He was treated discharged with the medications listed below under Medication List.  Medical problems were identified and treated as needed.  Home medications were restarted as appropriate.  Improvement was monitored by observation and Annabelle Harman daily report of symptom reduction.  Emotional and mental status was monitored by daily self-inventory reports completed by Annabelle Harman and clinical staff.         Annabelle Harman was evaluated by the treatment team for  stability and plans for continued recovery upon discharge.  Annabelle Harman motivation was an integral factor for scheduling further treatment.  Employment, transportation, bed availability, health status, family support, and any pending legal issues were also considered during his hospital stay.  He was offered further treatment options upon discharge including but not limited to Residential, Intensive Outpatient, and  Outpatient treatment.  Annabelle Harman will follow up with the services as listed below under Follow Up Information.     Upon completion of this admission the patient was both mentally and medically stable for discharge denying suicidal/homicidal ideation, auditory/visual/tactile hallucinations, delusional thoughts and paranoia.      Consults:  psychiatry  Significant Diagnostic Studies:  labs: per ED  Discharge Vitals:   Blood pressure 131/73, pulse 84, temperature 98.5 F (36.9 C), temperature source Oral, resp. rate 16, height  (1.676 m), weight 56.7 kg (125 lb), SpO2 98 %. Body mass index is 20.19 kg/(m^2). Lab Results:   No results found for this or any previous visit (from the past 72 hour(s)).  Physical Findings: AIMS: Facial and Oral Movements Muscles of Facial Expression: None, normal Lips and Perioral Area: None, normal Jaw: None, normal Tongue: None, normal,Extremity Movements Upper (arms, wrists, hands, fingers): None, normal Lower (legs, knees, ankles, toes): None, normal, Trunk Movements Neck, shoulders, hips: None, normal, Overall Severity Severity of abnormal movements (highest score from questions above): None, normal Incapacitation due to abnormal movements: None, normal Patient's awareness of abnormal movements (rate only patient's report): No Awareness, Dental Status Current problems with teeth and/or dentures?: No Does patient usually wear dentures?: No  CIWA:    COWS:      See Psychiatric Specialty Exam and Suicide Risk Assessment completed by Attending Physician prior to discharge.  Discharge destination:  Home  Is patient on multiple antipsychotic therapies at discharge:  No   Has Patient had three or more failed trials of antipsychotic monotherapy by history:  No    Recommended Plan for Multiple Antipsychotic Therapies: NA      Discharge Instructions    Diet - low sodium heart healthy    Complete by:  As directed      Increase activity  slowly    Complete by:  As directed             Medication List    STOP taking these medications        clonazePAM 1 MG tablet  Commonly known as:  KLONOPIN     doxycycline 100 MG tablet  Commonly known as:  VIBRA-TABS     ibuprofen 200 MG tablet  Commonly known as:  ADVIL,MOTRIN     lisinopril 5 MG tablet  Commonly known as:  PRINIVIL,ZESTRIL     multivitamin with minerals tablet     nicotine 21 mg/24hr patch  Commonly known as:  NICODERM CQ - dosed in mg/24 hours     traZODone 50 MG tablet  Commonly known as:  DESYREL      TAKE these medications      Indication   carvedilol 6.25 MG tablet  Commonly known as:  COREG  Take 1 tablet (6.25 mg total) by mouth 2 (two) times daily with a meal.   Indication:  High Blood Pressure of Unknown Cause     citalopram 20 MG tablet  Commonly known as:  CELEXA  Take 1 tablet (20 mg total) by mouth daily.   Indication:  Depression     hydrOXYzine 25 MG tablet  Commonly known  as:  ATARAX/VISTARIL  Take 1 tablet (25 mg total) by mouth 2 (two) times daily between meals as needed for anxiety.   Indication:  Anxiety Neurosis     mirtazapine 15 MG tablet  Commonly known as:  REMERON  Take 1 tablet (15 mg total) by mouth at bedtime.   Indication:  Trouble Sleeping     QUEtiapine 50 MG tablet  Commonly known as:  SEROQUEL  Take 1 tablet (50 mg total) by mouth 2 (two) times daily in the am and at bedtime..   Indication:  mood stability       Follow-up Information    Follow up with [facility]  On 06/23/2015.   Why:  Screening appointment for possible admission on Monday Oct. 31st at 8 am. Please bring your [place] ID and insurance card. Bring 2 weeks of medications and clothing in case you are accepted. Call office if you need to reschedule.   Contact information:   [address] [place], [place] [address] Phone: [phone]      [facility]   Why:  Walk in between 8am-9am for hospital  follow-up/medication management/assessment for mental health services.    Contact information:   [address] [place], [place] [address] Phone: [phone] Fax: [phone]      Follow-up recommendations:  Activity:  as tol Diet:  as tol  Comments:  1.  Take all your medications as prescribed.              2.  Report any adverse side effects to outpatient provider.                       3.  Patient instructed to not use alcohol or illegal drugs while on prescription medicines.            4.  In the event of worsening symptoms, instructed patient to call 911, the crisis hotline or go to nearest emergency room for evaluation of symptoms.  Total Discharge Time:  30 min  Signed: Velna HatchetSheila May Agustin AGNP-BC 06/24/2015, 2:51 PM   I have examined the patient and agree with the discharge plan and findings. I have also done suicide assessment on this patient.

## 2015-06-22 NOTE — Progress Notes (Addendum)
Patient did not attend the evening speaker AA meeting. Pt was notified that group was beginning but returned to his room. Pt reported feeling anxious around a lot of people.

## 2015-06-22 NOTE — Progress Notes (Signed)
D:Affect is appropriate to mood. Anxious at times. States that he is preparing for D/C tomorrow morning and feels that he is ready to go. A:Support and encouragement offered. R:Receptive. No complaints of pain or problems at this time.

## 2015-06-22 NOTE — [facility] Group Notes (Signed)
[facility] Group Notes: (Clinical Social Work)   06/22/2015      Type of Therapy:  Group Therapy   Participation Level:  Did Not Attend despite MHT prompting   Ambrose MantleMareida Grossman-Orr, LCSW 06/22/2015, 12:50 PM

## 2015-06-22 NOTE — Progress Notes (Signed)
[facility] Group Notes:  (Nursing/MHT/Case Management/Adjunct)  Date:  06/22/2015  Time:  1:36 AM  Type of Therapy:  Psychoeducational Skills  Participation Level:  Minimal  Participation Quality:  Attentive  Affect:  Appropriate  Cognitive:  Lacking  Insight:  Lacking  Engagement in Group:  Limited  Modes of Intervention:  Education  Summary of Progress/Problems: Patient shared in group that his day was "all right", however, he would not divulge any additional details. In terms of the theme for the day, his coping skill will be to meditate.   Upton Russey S 06/22/2015, 1:36 AM

## 2015-06-22 NOTE — BHH Suicide Risk Assessment (Signed)
[facility] Discharge Suicide Risk Assessment   Demographic Factors:  Male  Total Time spent with patient: 30 minutes  Musculoskeletal: Strength & Muscle Tone: within normal limits Gait & Station: normal Patient leans: N/A  Psychiatric Specialty Exam: Physical Exam  Constitutional: He appears well-developed.  HENT:  Head: Normocephalic.  Skin: He is not diaphoretic.    Review of Systems  Constitutional: Negative.   Skin: Negative for rash.  Psychiatric/Behavioral: Negative for depression. The patient is nervous/anxious.     Blood pressure 131/73, pulse 84, temperature 98.5 F (36.9 C), temperature source Oral, resp. rate 16, height  (1.676 m), weight 56.7 kg (125 lb), SpO2 98 %.Body mass index is 20.19 kg/(m^2).  General Appearance: Casual  Eye Contact::  Fair  Speech:  Normal Rate409  Volume:  Normal  Mood:  Euthymic  Affect:  Constricted  Thought Process:  Coherent  Orientation:  Full (Time, Place, and Person)  Thought Content:  Rumination  Suicidal Thoughts:  No  Homicidal Thoughts:  No  Memory:  Immediate;   Fair Recent;   Fair  Judgement:  Fair  Insight:  Shallow  Psychomotor Activity:  Normal  Concentration:  Fair  Recall:  [employer] of Knowledge:Fair  Language: Fair  Akathisia:  Negative  Handed:  Right  AIMS (if indicated):     Assets:  Desire for Improvement  Sleep:  Number of Hours: 5.75  Cognition: WNL  ADL's:  Intact   Have you used any form of tobacco in the last 30 days? (Cigarettes, Smokeless Tobacco, Cigars, and/or Pipes): Yes  Has this patient used any form of tobacco in the last 30 days? (Cigarettes, Smokeless Tobacco, Cigars, and/or Pipes) Yes, A prescription for an FDA-approved tobacco cessation medication was offered at discharge and the patient refused  Mental Status Per Nursing Assessment::   On Admission:  NA  Current Mental Status by Physician: see above  Loss Factors: Decrease in vocational status  Historical  Factors: Impulsivity  Risk Reduction Factors:   Positive social support and Positive coping skills or problem solving skills  Continued Clinical Symptoms:  Depression:   Anhedonia Hopelessness Impulsivity Alcohol/Substance Abuse/Dependencies Unstable or Poor Therapeutic Relationship Previous Psychiatric Diagnoses and Treatments  Cognitive Features That Contribute To Risk:  Closed-mindedness    Suicide Risk:  Minimal: No identifiable suicidal ideation.  Patients presenting with no risk factors but with morbid ruminations; may be classified as minimal risk based on the severity of the depressive symptoms  Principal Problem: Substance induced mood disorder [facility]) Discharge Diagnoses:  Patient Active Problem List   Diagnosis Date Noted  . Substance induced mood disorder (HCC) [F19.94] 06/16/2015  . 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 MDD (major depressive disorder), recurrent severe, without psychosis (HCC) [F33.2] 06/14/2015  . 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 Hypokalemia [E87.6] 06/13/2015  . Intentional benzodiazepine overdose (HCC) [T42.4X2A] 06/12/2015  . HCAP (healthcare-associated pneumonia) [J18.9] 06/12/2015  . Encephalopathy [G93.40]   . Acute respiratory failure (HCC) [J96.00] 06/09/2015    Follow-up Information    Follow up with [facility]  On 06/23/2015.   Why:  Screening appointment for possible admission on Monday Oct. 31st at 8 am. Please bring your [place] ID and insurance card. Bring 2 weeks of medications and clothing in case you are accepted. Call office if you need to reschedule.   Contact information:   [address] [place], [place] [address] Phone: [phone]      Follow up with [facility].   Why:  Walk in  between 8am-9am for hospital follow-up/medication management/assessment for mental health services.    Contact information:   [address] [place], [place] [address] Phone: [phone] Fax: [phone]       Plan Of Care/Follow-up recommendations:  Activity:  as tolerated Diet:  regular  Is patient on multiple antipsychotic therapies at discharge:  No   Has Patient had three or more failed trials of antipsychotic monotherapy by history:  No  Recommended Plan for Multiple Antipsychotic Therapies: NA    Randee Upchurch 06/22/2015, 10:02 PM

## 2015-06-22 NOTE — BHH Group Notes (Signed)
BHH Group Notes:  (Nursing/MHT/Case Management/Adjunct)  Date:  06/22/2015  Time:  1000  Type of Therapy:  Nurse Education - Healthy Support Systems  Participation Level:  Did Not Attend  Participation Quality:    Affect:    Cognitive:    Insight:    Engagement in Group:    Modes of Intervention:    Summary of Progress/Problems: Patient was invited to group however elected to remain in bed.   Merian CapronFriedman, [facility] 06/22/2015, [phone]

## 2015-06-22 NOTE — Progress Notes (Signed)
D: Patient seen watching TV and interacting with peers on day room. Cheerful and appropriate. Patient excited leaving tomorrow ,though verbalizes concern about where he's going. Patient stated "Hopefully, it's gonna do me good". Denies pain, SI, AH/VH at this time. No behavioral issues noted. A: support and encouragement offered as needed. Due/prn medications given as ordered. Every 15 minutes check for safety maintained. Will continue to monitor patient for safety and stability. R: Patient receptive to interventions.

## 2015-06-23 NOTE — Progress Notes (Signed)
[facility] Left unit at 7:20 am in stable condition. Escorted to the lobby.

## 2015-07-10 ENCOUNTER — Encounter (HOSPITAL_COMMUNITY): Payer: Self-pay

## 2015-09-26 ENCOUNTER — Telehealth: Payer: Self-pay

## 2015-09-26 NOTE — Telephone Encounter (Signed)
Please see previous message looks like they agreed to come in

## 2015-09-26 NOTE — Telephone Encounter (Signed)
Pt last saw dr Cleta Alberts in September and left [place]-he is now coming back to [place] to a group home and is needing new rx.s for his meds, because medicaid will not refill them in the other location Told the patient mom that they will need to come in and be seen since it was September and they stated they would probably come in on the weekend   Best number [phone]

## 2015-09-27 ENCOUNTER — Ambulatory Visit (INDEPENDENT_AMBULATORY_CARE_PROVIDER_SITE_OTHER): Payer: Self-pay | Admitting: Family Medicine

## 2015-09-27 VITALS — BP 150/80 | HR 67 | Temp 98.0°F | Resp 16 | Ht 66.0 in | Wt 149.2 lb

## 2015-09-27 DIAGNOSIS — F332 Major depressive disorder, recurrent severe without psychotic features: Secondary | ICD-10-CM

## 2015-09-27 DIAGNOSIS — B353 Tinea pedis: Secondary | ICD-10-CM

## 2015-09-27 DIAGNOSIS — F411 Generalized anxiety disorder: Secondary | ICD-10-CM

## 2015-09-27 DIAGNOSIS — K219 Gastro-esophageal reflux disease without esophagitis: Secondary | ICD-10-CM

## 2015-09-27 DIAGNOSIS — F17200 Nicotine dependence, unspecified, uncomplicated: Secondary | ICD-10-CM

## 2015-09-27 DIAGNOSIS — G479 Sleep disorder, unspecified: Secondary | ICD-10-CM

## 2015-09-27 DIAGNOSIS — F1910 Other psychoactive substance abuse, uncomplicated: Secondary | ICD-10-CM

## 2015-09-27 MED ORDER — RISPERIDONE 0.5 MG PO TABS: 0.5000 mg | ORAL_TABLET | Freq: Two times a day (BID) | ORAL | Status: AC

## 2015-09-27 MED ORDER — BENZTROPINE MESYLATE 1 MG PO TABS: 1.0000 mg | ORAL_TABLET | Freq: Two times a day (BID) | ORAL | Status: DC

## 2015-09-27 MED ORDER — QUETIAPINE FUMARATE 50 MG PO TABS: ORAL_TABLET | ORAL | Status: DC

## 2015-09-27 MED ORDER — BUPROPION HCL ER (XL) 150 MG PO TB24: 150.0000 mg | ORAL_TABLET | Freq: Every day | ORAL | Status: AC

## 2015-09-27 MED ORDER — MIRTAZAPINE 30 MG PO TABS: 30.0000 mg | ORAL_TABLET | Freq: Every day | ORAL | Status: DC

## 2015-09-27 MED ORDER — NICOTINE 21 MG/24HR TD PT24: 21.0000 mg | MEDICATED_PATCH | Freq: Every day | TRANSDERMAL | Status: AC

## 2015-09-27 MED ORDER — OMEPRAZOLE 40 MG PO CPDR: 40.0000 mg | DELAYED_RELEASE_CAPSULE | Freq: Every day | ORAL | Status: AC

## 2015-09-27 MED ORDER — CLOTRIMAZOLE 1 % EX CREA: 1.0000 "application " | TOPICAL_CREAM | Freq: Two times a day (BID) | CUTANEOUS | Status: DC

## 2015-09-27 MED ORDER — CLONIDINE HCL 0.1 MG PO TABS: 0.1000 mg | ORAL_TABLET | Freq: Three times a day (TID) | ORAL | Status: DC

## 2015-09-27 MED ORDER — TRAZODONE HCL 50 MG PO TABS: 25.0000 mg | ORAL_TABLET | Freq: Every evening | ORAL | Status: AC | PRN

## 2015-09-27 MED ORDER — TRIAMCINOLONE ACETONIDE 0.1 % EX CREA: 1.0000 "application " | TOPICAL_CREAM | Freq: Two times a day (BID) | CUTANEOUS | Status: DC

## 2015-09-27 NOTE — Patient Instructions (Addendum)
Continue your current medications per the list you were discharged on  If the rash gets worse or if not improving please return  Follow-up with the psychiatrist the you have an appointment with next week  Continue to regularly go to NA, to be involved at the halfway house, and to pursue employment.  Get regular exercise  Stay away from tobacco  Return at anytime if needed  Take the omeprazole 40 mg 1 at bedtime for reflux and heartburn  Continue to seek spiritual growth to involvement in his church

## 2015-09-27 NOTE — Progress Notes (Signed)
Patient ID: Johnny Fuller, male    DOB: 1996/07/06  Age: 20 y.o. MRN: [id number]  Chief Complaint  Patient presents with  . Medication issue    Remeron, Trazodone, Seroquel, Clonidine, Kenalog. Per pt insurance will not voer prescriptions written out of state  . Medication Refill    Wellbutrin    Subjective:   20 year old male who has been here previously. Since then he was hospitalized at [facility] in [place]. He had a 30 day's stay. He was treated with a number of medications because of his substance use problems and depression. He has returned to [facility] where he has a foster mother. He is estranged from his mother does not know his father. He is in a halfway house. He goes to NA daily. He has a charge that he is involved with. He has the support of his foster mother. He gets regular exercise. There are a couple job prospects he is looking into. He just left the hospital there with straight to the bus station and came down to [place]. He had prescriptions that they could not get all of them without a local prescriber. He brought in a printout from the hospital, which he would like to be able to get treated with here. He will be seeing the psychiatrist next week who hopefully can take over prescribing his psychiatric medications.  Current allergies, medications, problem list, past/family and social histories reviewed.  There was a note from [name], his foster mother, giving information that they were committed to helping [place]. They are willing to be of help if he desires [place] to work with them at any point. Although I did not tell him about the note, I did ask if he wanted me to speak with his foster parents and he said no he did not at this point.  Objective:  BP 150/80 mmHg  Pulse 67  Temp(Src) 98 F (36.7 C) (Oral)  Resp 16  Ht  (1.676 m)  Wt 149 lb 3.2 oz (67.677 kg)  BMI 24.09 kg/m2  SpO2 98%  Well spoken, healthy young man. Chest clear.  Heart regular without murmur. Abdomen soft and nontender. He has chronic dryness and flaking skin on the soles of his feet.  Assessment & Plan:   Assessment: 1. Severe episode of recurrent major depressive disorder, without psychotic features (HCC)   2. Anxiety state   3. Substance abuse   4. Tinea pedis of both feet   5. Sleep disorder   6. Tobacco use disorder   7. Gastroesophageal reflux disease, esophagitis presence not specified       Plan: We'll continue him on the same medications per the list he was brought. Also add something for his reflux. The psychiatrist should be able to manage most of his medications. He may need to return here periodically for the nonpsychiatric care needs. If his skin does not improve on his feet we will need to do some further testing. If the reflux gets worse we will need to further evaluate.  No orders of the defined types were placed in this encounter.    Meds ordered this encounter  Medications  . DISCONTD: risperiDONE (RISPERDAL) 0.5 MG tablet    Sig: Take 0.5 mg by mouth 2 (two) times daily.  . benztropine (COGENTIN) 1 MG tablet    Sig: Take 1 tablet (1 mg total) by mouth 2 (two) times daily.    Dispense:  60 tablet    Refill:  0  .  buPROPion (WELLBUTRIN XL) 150 MG 24 hr tablet    Sig: Take 1 tablet (150 mg total) by mouth daily.    Dispense:  30 tablet    Refill:  0  . cloNIDine (CATAPRES) 0.1 MG tablet    Sig: Take 1 tablet (0.1 mg total) by mouth 3 (three) times daily.    Dispense:  30 tablet    Refill:  0  . mirtazapine (REMERON) 30 MG tablet    Sig: Take 1 tablet (30 mg total) by mouth at bedtime.    Dispense:  30 tablet    Refill:  0  . nicotine (NICODERM CQ - DOSED IN MG/24 HOURS) 21 mg/24hr patch    Sig: Place 1 patch (21 mg total) onto the skin daily.    Dispense:  28 patch    Refill:  0  . QUEtiapine (SEROQUEL) 50 MG tablet    Sig: Take one 4 times daily as needed    Dispense:  120 tablet    Refill:  0  .  clotrimazole (LOTRIMIN) 1 % cream    Sig: Apply 1 application topically 2 (two) times daily.    Dispense:  30 g    Refill:  1  . risperiDONE (RISPERDAL) 0.5 MG tablet    Sig: Take 1 tablet (0.5 mg total) by mouth 2 (two) times daily.    Dispense:  60 tablet    Refill:  0  . triamcinolone cream (KENALOG) 0.1 %    Sig: Apply 1 application topically 2 (two) times daily.    Dispense:  30 g    Refill:  1  . traZODone (DESYREL) 50 MG tablet    Sig: Take 0.5-1 tablets (25-50 mg total) by mouth at bedtime as needed for sleep.    Dispense:  30 tablet    Refill:  0  . omeprazole (PRILOSEC) 40 MG capsule    Sig: Take 1 capsule (40 mg total) by mouth daily.    Dispense:  30 capsule    Refill:  1         Patient Instructions  Continue your current medications per the list you were discharged on  If the rash gets worse or if not improving please return  Follow-up with the psychiatrist the you have an appointment with next week  Continue to regularly go to NA, to be involved at the halfway house, and to pursue employment.  Get regular exercise  Stay away from tobacco  Return at anytime if needed  Take the omeprazole 40 mg 1 at bedtime for reflux and heartburn  Continue to seek spiritual growth to involvement in his church       No Follow-up on file.   Jackquline Branca, MD 09/27/2015

## 2015-10-24 ENCOUNTER — Other Ambulatory Visit: Payer: Self-pay | Admitting: Family Medicine

## 2015-10-24 NOTE — Telephone Encounter (Signed)
Discussed patient record with last attending physician D. Alwyn RenHopper, who advised that he would be unable to refill all rx as pt needs to be under the care of a psychiatrist.

## 2015-11-18 IMAGING — CR DG CHEST 2V
2 series · 2 of 2 positions shown · non-contrast
Comparison: 06/11/2015

CLINICAL DATA: Left lateral acute chest and axillary pain since
last night.

EXAM:
CHEST  2 VIEW

[w chest pa]
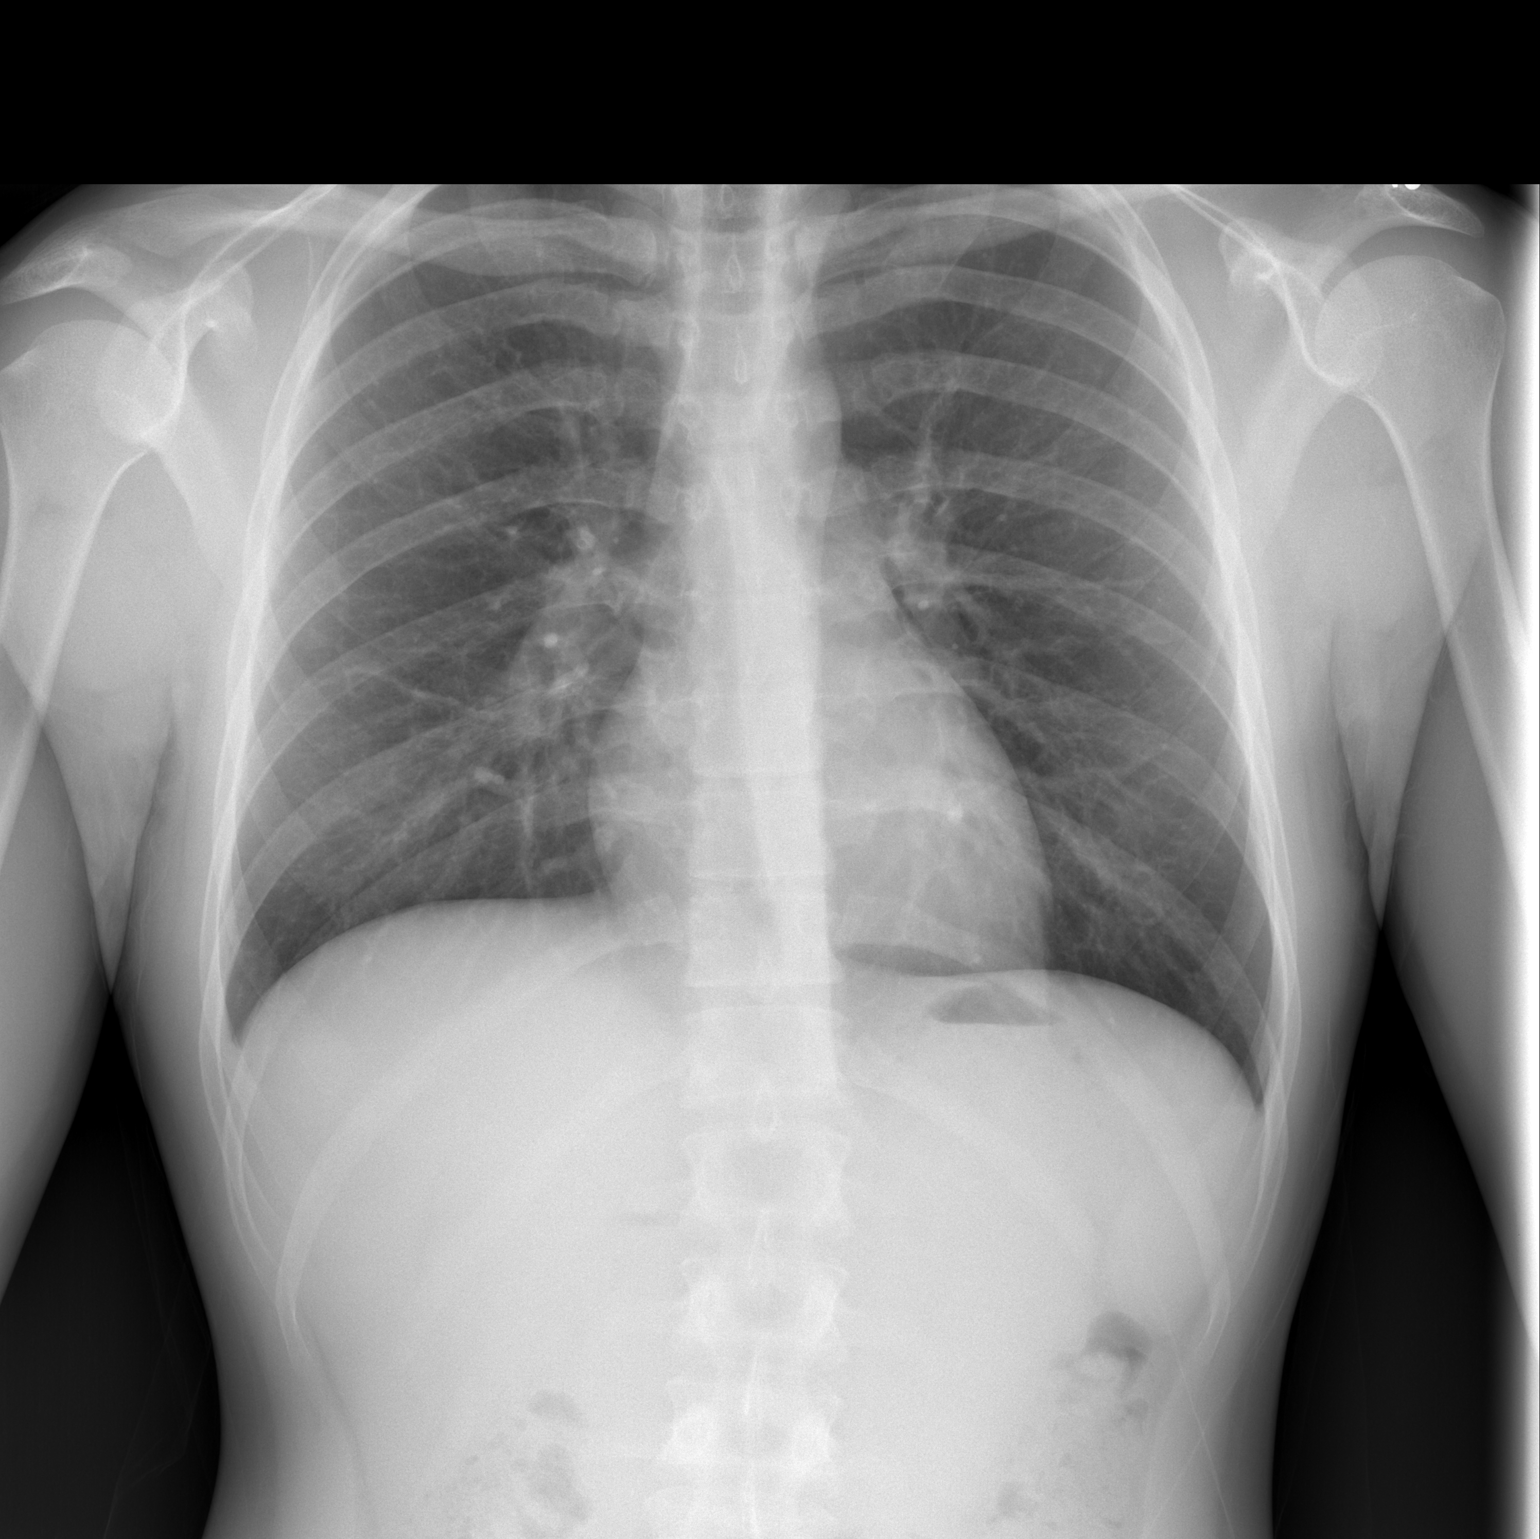

[w chest lat]
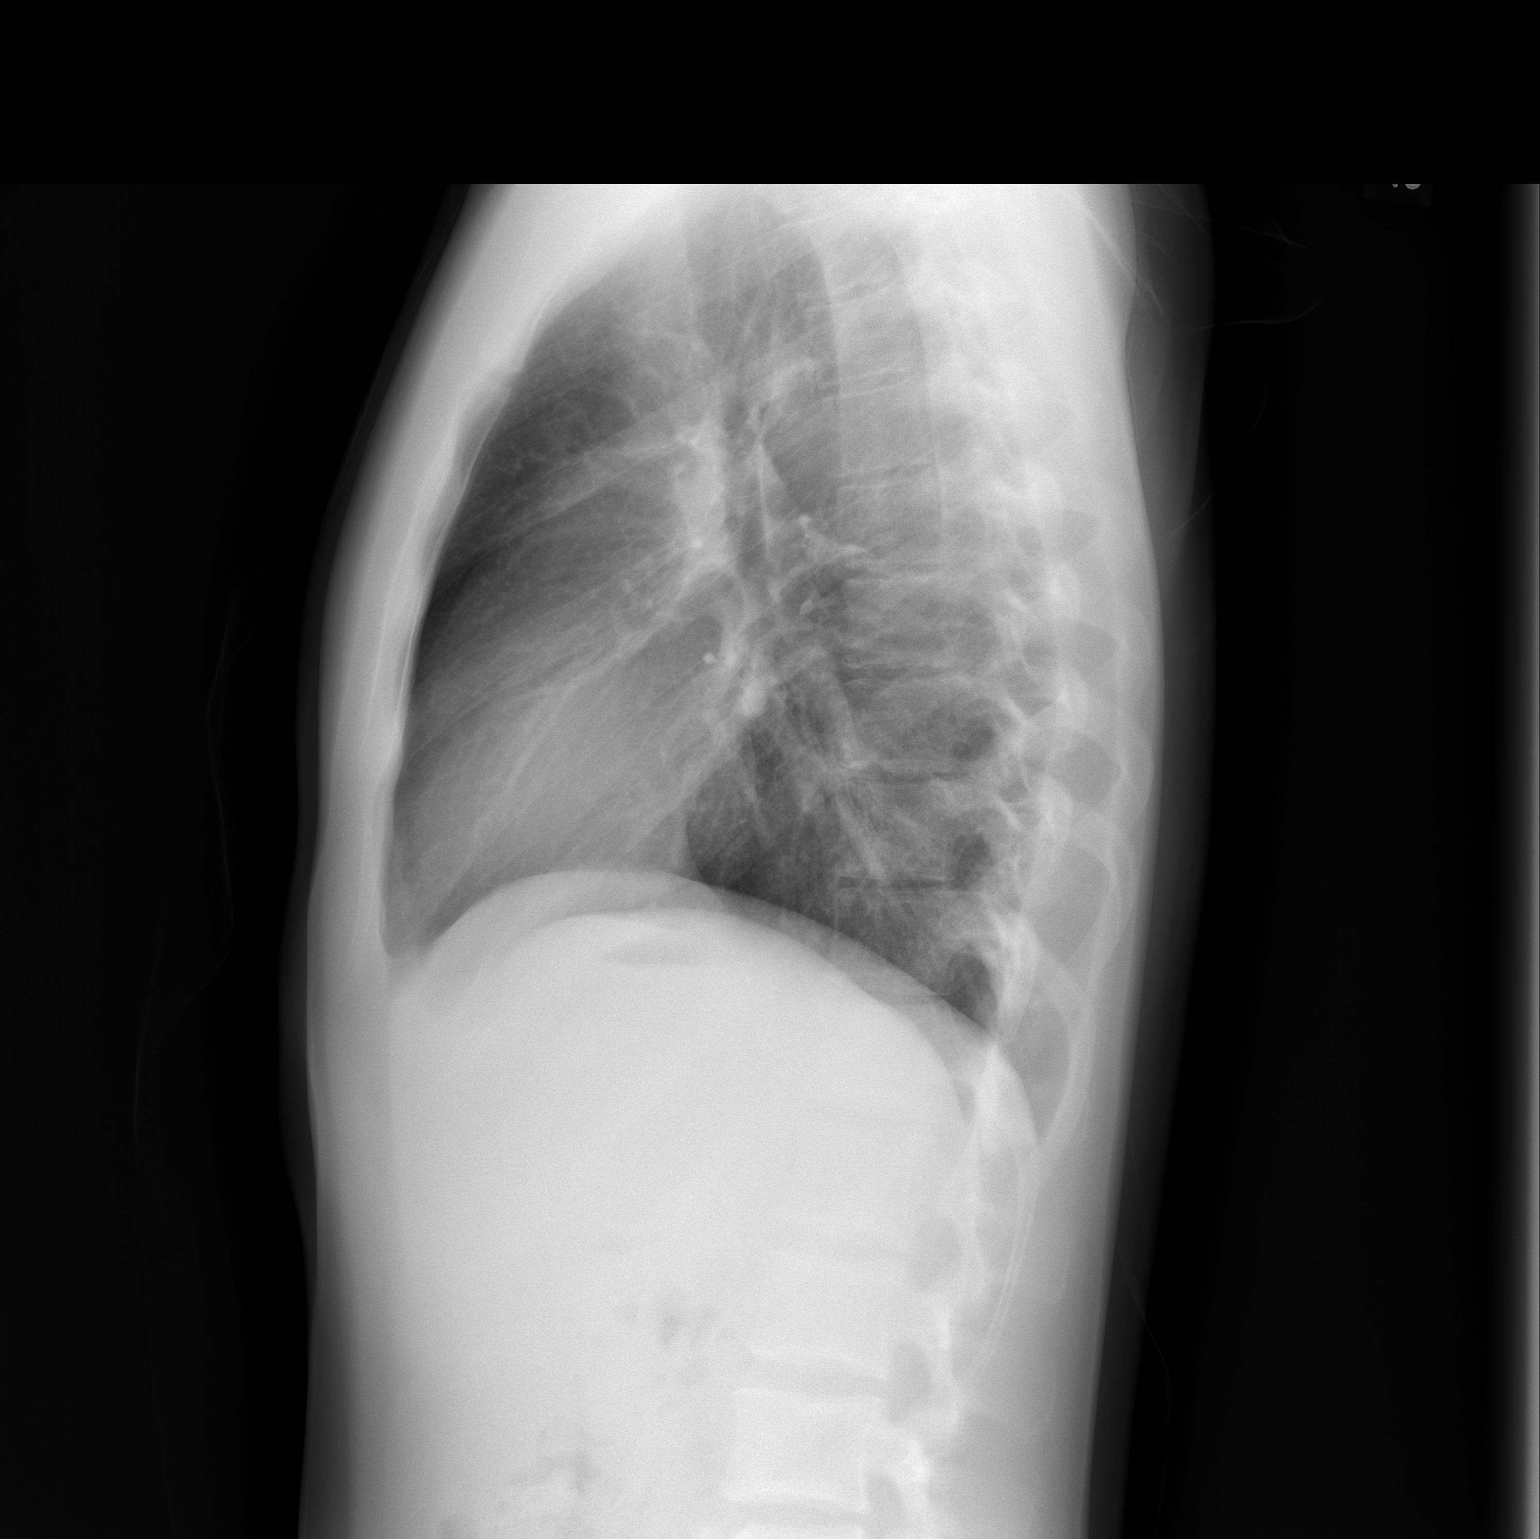

[2 of 2 positions shown; findings below may reference images not displayed]

FINDINGS: The heart size and mediastinal contours are within normal limits.
Both lungs are clear. The visualized skeletal structures are
unremarkable.
IMPRESSION: No active cardiopulmonary disease.

## 2015-12-17 ENCOUNTER — Encounter (HOSPITAL_COMMUNITY): Payer: Self-pay | Admitting: Emergency Medicine

## 2015-12-17 ENCOUNTER — Emergency Department (HOSPITAL_COMMUNITY)
Admission: EM | Admit: 2015-12-17 | Discharge: 2015-12-18 | Disposition: A | Discharge status: Home / Self Care | Payer: Medicaid Other | Attending: Emergency Medicine | Admitting: Emergency Medicine

## 2015-12-17 DIAGNOSIS — Z791 Long term (current) use of non-steroidal anti-inflammatories (NSAID): Secondary | ICD-10-CM | POA: Diagnosis not present

## 2015-12-17 DIAGNOSIS — F17200 Nicotine dependence, unspecified, uncomplicated: Secondary | ICD-10-CM | POA: Diagnosis not present

## 2015-12-17 DIAGNOSIS — F1490 Cocaine use, unspecified, uncomplicated: Secondary | ICD-10-CM | POA: Insufficient documentation

## 2015-12-17 DIAGNOSIS — F332 Major depressive disorder, recurrent severe without psychotic features: Secondary | ICD-10-CM | POA: Diagnosis not present

## 2015-12-17 DIAGNOSIS — R45851 Suicidal ideations: Secondary | ICD-10-CM | POA: Insufficient documentation

## 2015-12-17 DIAGNOSIS — Z79899 Other long term (current) drug therapy: Secondary | ICD-10-CM | POA: Diagnosis not present

## 2015-12-17 DIAGNOSIS — F329 Major depressive disorder, single episode, unspecified: Secondary | ICD-10-CM | POA: Diagnosis not present

## 2015-12-17 DIAGNOSIS — F1510 Other stimulant abuse, uncomplicated: Secondary | ICD-10-CM | POA: Insufficient documentation

## 2015-12-17 LAB — CBC
HCT: 46.5 % (ref 39.0–52.0)
Hemoglobin: 16.3 g/dL (ref 13.0–17.0)
MCH: 31.1 pg (ref 26.0–34.0)
MCHC: 35.1 g/dL (ref 30.0–36.0)
MCV: 88.7 fL (ref 78.0–100.0)
Platelets: 320 10*3/uL (ref 150–400)
RBC: 5.24 MIL/uL (ref 4.22–5.81)
RDW: 12.9 % (ref 11.5–15.5)
WBC: 7.8 10*3/uL (ref 4.0–10.5)

## 2015-12-17 LAB — COMPREHENSIVE METABOLIC PANEL
ALT: 43 U/L (ref 17–63)
ANION GAP: 8 (ref 5–15)
AST: 29 U/L (ref 15–41)
Albumin: 5 g/dL (ref 3.5–5.0)
Alkaline Phosphatase: 69 U/L (ref 38–126)
BILIRUBIN TOTAL: 0.7 mg/dL (ref 0.3–1.2)
BUN: 11 mg/dL (ref 6–20)
CO2: 26 mmol/L (ref 22–32)
Calcium: 9.9 mg/dL (ref 8.9–10.3)
Chloride: 105 mmol/L (ref 101–111)
Creatinine, Ser: 0.85 mg/dL (ref 0.61–1.24)
GFR calc Af Amer: >60 mL/min (ref >60)
Glucose, Bld: 107 mg/dL — ABNORMAL HIGH (ref 65–99)
POTASSIUM: 4 mmol/L (ref 3.5–5.1)
Sodium: 139 mmol/L (ref 135–145)
TOTAL PROTEIN: 8 g/dL (ref 6.5–8.1)

## 2015-12-17 LAB — RAPID URINE DRUG SCREEN, HOSP PERFORMED
AMPHETAMINES: NOT DETECTED
BARBITURATES: NOT DETECTED
Benzodiazepines: NOT DETECTED
COCAINE: NOT DETECTED
OPIATES: NOT DETECTED
TETRAHYDROCANNABINOL: NOT DETECTED

## 2015-12-17 LAB — ACETAMINOPHEN LEVEL: Acetaminophen (Tylenol), Serum: <10 ug/mL — ABNORMAL LOW (ref 10–30)

## 2015-12-17 LAB — SALICYLATE LEVEL

## 2015-12-17 LAB — ETHANOL

## 2015-12-17 NOTE — ED Notes (Signed)
Pt voluntary with [employer] reports SI x3 days.

## 2015-12-17 NOTE — BH Assessment (Signed)
Tele Assessment Note   Johnny Fuller is an 20 y.o. male. Pt reports SI with no plan. Pt denies HI. Pt denies AVH. Pt reports worsening depression. Pt states he has been depressed since the age of 20 years old. Pt contacted [employer] because of increased SI. [facility] the [facility] to [facility]. Pt reports 3 previous SI attempts. Pt has received inpatient treatment at [facility] 2x. Pt is currently receiving outpatient treatment at [facility]. Pt is prescribed Zyprexa. Pt has not been taking his medication. Pt is 4 months clean from Meth. Pt states he does not take Zyprexa because it does not get him high. Pt denies abuse.  Pt appeared agitated during the assessment.   Writer consulted with Julieanne Cotton, NP. Per Josephine Pt meets inpatient criteria.   Diagnosis:  F33.2 MDD, recurrent, severe  Past Medical History:  Past Medical History  Diagnosis Date  . Anemia   . Depression   . Anxiety   . Genital herpes     Past Surgical History  Procedure Laterality Date  . No past surgeries      Family History:  Family History  Problem Relation Age of Onset  . Diabetes Father   . Hyperlipidemia Father   . Hypertension Father   . CAD Neg Hx     Social History:  reports that he has been smoking.  He does not have any smokeless tobacco history on file. 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 He reports that he does not drink alcohol.  Additional Social History:  Alcohol / Drug Use Pain Medications: Pt denies Prescriptions: Zyprexa Over the Counter: Pt denies History of alcohol / drug use?: Yes Longest period of sobriety (when/how long): 4 months Negative Consequences of Use: Legal, Personal relationships, Work / Programmer, multimedia, Surveyor, quantity Withdrawal Symptoms: Agitation, Sweats, Fever / Chills, Nausea / Vomiting Substance #1 Name of Substance 1: Meth 1 - Age of First Use: unknown 1 - Amount (size/oz): unknown 1 - Frequency: uknown 1 - Duration: clean for 4 months 1 -  Last Use / Amount: 4 months ago  CIWA: CIWA-Ar BP: 146/84 mmHg Pulse Rate: 88 COWS:    PATIENT STRENGTHS: (choose at least two) Average or above average intelligence Communication skills  Allergies:  Allergies  Allergen Reactions  . Sulfa Antibiotics Rash    Unknown reaction was told as a child     Home Medications:  (Not in a hospital admission)  OB/GYN Status:  No LMP for male patient.  General Assessment Data Location of Assessment: [facility] TTS Assessment: In system Is this a Tele or Face-to-Face Assessment?: Face-to-Face Is this an Initial Assessment or a Re-assessment for this encounter?: Initial Assessment Marital status: Single Maiden name: NA Is patient pregnant?: No Pregnancy Status: No Living Arrangements: Alone, Other (Comment) ([facility]) Can pt return to current living arrangement?: Yes Admission Status: Voluntary Is patient capable of signing voluntary admission?: Yes Referral Source: Self/Family/Friend Insurance type: Medicaid     Crisis Care Plan Living Arrangements: Alone, Other (Comment) Sport and exercise psychologist) Legal Guardian: Other: (self) Name of Psychiatrist: [facility] Name of Therapist: [facility]  Education Status Is patient currently in school?: No Current Grade: NA Highest grade of school patient has completed: some college Name of school: NA Contact person: NA  Risk to self with the past 6 months Suicidal Ideation: Yes-Currently Present Has patient been a risk to self within the past 6 months prior to admission? : No Suicidal Intent: Yes-Currently Present Has patient had any suicidal  intent within the past 6 months prior to admission? : No Is patient at risk for suicide?: Yes Suicidal Plan?: No Has patient had any suicidal plan within the past 6 months prior to admission? : No Access to Means: No What has been your use of drugs/alcohol within the last 12 months?: Meth Previous Attempts/Gestures: Yes How many times?: 3 Other Self Harm Risks:  NA Triggers for Past Attempts: None known Intentional Self Injurious Behavior: None Family Suicide History: No Recent stressful life event(s): Other (Comment) (clean from meth for 4 months) Persecutory voices/beliefs?: No Depression: Yes Depression Symptoms: Despondent, Insomnia, Tearfulness, Isolating, Fatigue, Guilt, Loss of interest in usual pleasures, Feeling worthless/self pity, Feeling angry/irritable Substance abuse history and/or treatment for substance abuse?: No Suicide prevention information given to non-admitted patients: Not applicable  Risk to Others within the past 6 months Homicidal Ideation: No Does patient have any lifetime risk of violence toward others beyond the six months prior to admission? : No Thoughts of Harm to Others: No Current Homicidal Intent: No Current Homicidal Plan: No Access to Homicidal Means: No Identified Victim: NA History of harm to others?: No Assessment of Violence: None Noted Violent Behavior Description: NA Does patient have access to weapons?: No Criminal Charges Pending?: No Does patient have a court date: No Is patient on probation?: No  Psychosis Hallucinations: None noted Delusions: None noted  Mental Status Report Appearance/Hygiene: Unremarkable Eye Contact: Fair Motor Activity: Freedom of movement Speech: Logical/coherent Level of Consciousness: Alert Mood: Depressed, Sad Affect: Depressed, Sad Anxiety Level: Moderate Thought Processes: Coherent, Relevant Judgement: Unimpaired Orientation: Person, Place, Time, Situation, Appropriate for developmental age Obsessive Compulsive Thoughts/Behaviors: None  Cognitive Functioning Concentration: Normal Memory: Recent Intact, Remote Intact IQ: Average Insight: Fair Impulse Control: Fair Appetite: Fair Weight Loss: 0 Weight Gain: 0 Sleep: Decreased Total Hours of Sleep: 4 Vegetative Symptoms: None  ADLScreening [facility] Assessment Services) Patient's cognitive ability  adequate to safely complete daily activities?: Yes Patient able to express need for assistance with ADLs?: Yes Independently performs ADLs?: Yes (appropriate for developmental age)  Prior Inpatient Therapy Prior Inpatient Therapy: Yes Prior Therapy Dates: 2016 Prior Therapy Facilty/Provider(s): [facility] Reason for Treatment: depression  Prior Outpatient Therapy Prior Outpatient Therapy: Yes Prior Therapy Dates: 2017 Prior Therapy Facilty/Provider(s): [facility] Reason for Treatment: depression Does patient have an ACCT team?: No Does patient have Intensive In-House Services?  : No Does patient have [facility] services? : No Does patient have P4CC services?: No  ADL Screening (condition at time of admission) Patient's cognitive ability adequate to safely complete daily activities?: Yes Is the patient deaf or have difficulty hearing?: No Does the patient have difficulty seeing, even when wearing glasses/contacts?: No Does the patient have difficulty concentrating, remembering, or making decisions?: No Patient able to express need for assistance with ADLs?: Yes Does the patient have difficulty dressing or bathing?: No Independently performs ADLs?: Yes (appropriate for developmental age) Does the patient have difficulty walking or climbing stairs?: No Weakness of Legs: None Weakness of Arms/Hands: None       Abuse/Neglect Assessment (Assessment to be complete while patient is alone) Physical Abuse: Denies Verbal Abuse: Denies Sexual Abuse: Denies Exploitation of patient/patient's resources: Denies Self-Neglect: Denies     Merchant navy officer (For Healthcare) Does patient have an advance directive?: No Would patient like information on creating an advanced directive?: No - patient declined information    Additional Information 1:1 In Past 12 Months?: No CIRT Risk: No Elopement Risk: No Does patient have medical clearance?: Yes  Disposition:  Disposition Initial Assessment  Completed for this Encounter: Yes Disposition of Patient: Inpatient treatment program Type of inpatient treatment program: Adult  Emmit PomfretLevette,Jalaiya Oyster D 12/17/2015 6:19 PM

## 2015-12-17 NOTE — ED Notes (Signed)
Patient appears flat. Reports passive feelings of SI and AH. Denies HI. Rates feelings of depression 8/10. Denies any recent changes in sleep or appetite.  Encouragement offered. Snack provided.   Q 15 safety checks continue.

## 2015-12-17 NOTE — ED Notes (Addendum)
On admission to the [facility] pt's affect is flat and he is depressed and sad. He said that he called the police because he felt like killing himself. He has foster parents but [facility]. He has a history of IV methamphetamine use and addiction. He has been clean for 4 months and things are just not getting better. 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 He has not taken his medications for the past three days. He said that Zyprexa is causing panic attacks and he really does not notice medications effects if they do not make him high.

## 2015-12-17 NOTE — ED Provider Notes (Signed)
CSN: [id number]     Arrival date & time 12/17/15  1335 History   First MD Initiated Contact with Patient 12/17/15 1600     Chief Complaint  Patient presents with  . Suicidal  . Medical Clearance     (Consider location/radiation/quality/duration/timing/severity/associated sxs/prior Treatment) HPI   Patient has a 20 year old male with past medical history of anxiety and depression who presents the ED with complaint of suicide ideation, onset 3 days. Patient reports he has had worsening suicidal ideations with a plan. Patient reports having multiple thoughts of suicide including overdosing on heroin. Patient denies any attempt. Patient denies any recent stressors or major life changes/events. Patient denies any pain or complaints at this time. He notes he was last admitted for suicide ideation approximately 4-6 months ago and states he is currently followed by a psychologist, Dr. Langston Masker. He notes he last saw his psychologist a few weeks ago and states he was not having any suicidal ideation at that time. Denies HI, auditory/visual hallucinations. Denies alcohol or drug use. 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 Patient reports he has been taking his home meds as prescribed.  Past Medical History  Diagnosis Date  . Anemia   . Depression   . Anxiety   . Genital herpes    Past Surgical History  Procedure Laterality Date  . No past surgeries     Family History  Problem Relation Age of Onset  . Diabetes Father   . Hyperlipidemia Father   . Hypertension Father   . CAD Neg Hx    Social History  Substance Use Topics  . Smoking status: Current Some Day Smoker  . Smokeless tobacco: None  . Alcohol Use: No     Comment: occasional    Review of Systems  Psychiatric/Behavioral: Positive for suicidal ideas.  All other systems reviewed and are negative.     Allergies  Sulfa antibiotics  Home Medications   Prior to Admission medications    Medication Sig Start Date End Date Taking? Authorizing Provider  buPROPion (WELLBUTRIN XL) 150 MG 24 hr tablet Take 1 tablet (150 mg total) by mouth daily. 09/27/15  Yes Peyton Najjar, MD  naproxen sodium (ANAPROX) 220 MG tablet Take 220 mg by mouth 2 (two) times daily with a meal.   Yes Historical Provider, MD  omeprazole (PRILOSEC) 40 MG capsule Take 1 capsule (40 mg total) by mouth daily. 09/27/15  Yes Peyton Najjar, MD  risperiDONE (RISPERDAL) 0.5 MG tablet Take 1 tablet (0.5 mg total) by mouth 2 (two) times daily. 09/27/15  Yes Peyton Najjar, MD  traZODone (DESYREL) 50 MG tablet Take 0.5-1 tablets (25-50 mg total) by mouth at bedtime as needed for sleep. 09/27/15  Yes Peyton Najjar, MD  benztropine (COGENTIN) 1 MG tablet Take 1 tablet (1 mg total) by mouth 2 (two) times daily. Patient not taking: Reported on 12/17/2015 09/27/15   Peyton Najjar, MD  carvedilol (COREG) 6.25 MG tablet Take 1 tablet (6.25 mg total) by mouth 2 (two) times daily with a meal. Patient not taking: Reported on 09/27/2015 06/22/15   Adonis Brook, NP  citalopram (CELEXA) 20 MG tablet Take 1 tablet (20 mg total) by mouth daily. Patient not taking: Reported on 09/27/2015 06/22/15   Adonis Brook, NP  clonazePAM (KLONOPIN) 1 MG tablet Take 1 tablet (1 mg total) by mouth daily as needed (severe anxiety). Patient not taking: Reported on 09/27/2015 06/03/15   Jonny Ruiz  C Withrow, FNP  cloNIDine (CATAPRES) 0.1 MG tablet Take 1 tablet (0.1 mg total) by mouth 3 (three) times daily. Patient not taking: Reported on 12/17/2015 09/27/15   Peyton Najjar, MD  clotrimazole (LOTRIMIN) 1 % cream Apply 1 application topically 2 (two) times daily. Patient not taking: Reported on 12/17/2015 09/27/15   Peyton Najjar, MD  escitalopram (LEXAPRO) 10 MG tablet Take 1 tablet (10 mg total) by mouth daily. Patient not taking: Reported on 09/27/2015 06/02/15   Beau Fanny, FNP  hydrOXYzine (ATARAX/VISTARIL) 25 MG tablet Take 1 tablet (25 mg total) by mouth 2 (two)  times daily between meals as needed for anxiety. Patient not taking: Reported on 09/27/2015 06/22/15   Adonis Brook, NP  mirtazapine (REMERON) 15 MG tablet Take 1 tablet (15 mg total) by mouth at bedtime. Patient not taking: Reported on 09/27/2015 06/22/15   Adonis Brook, NP  mirtazapine (REMERON) 30 MG tablet Take 1 tablet (30 mg total) by mouth at bedtime. Patient not taking: Reported on 12/17/2015 09/27/15   Peyton Najjar, MD  nicotine (NICODERM CQ - DOSED IN MG/24 HOURS) 21 mg/24hr patch Place 1 patch (21 mg total) onto the skin daily. Patient not taking: Reported on 12/17/2015 06/02/15   Beau Fanny, FNP  nicotine (NICODERM CQ - DOSED IN MG/24 HOURS) 21 mg/24hr patch Place 1 patch (21 mg total) onto the skin daily. Patient not taking: Reported on 12/17/2015 09/27/15   Peyton Najjar, MD  QUEtiapine (SEROQUEL) 50 MG tablet Take one 4 times daily as needed Patient taking differently: Take 50 mg by mouth 4 (four) times daily as needed. Mood 09/27/15   Peyton Najjar, MD  QUEtiapine (SEROQUEL) 50 MG tablet Take 1 tablet (50 mg total) by mouth 2 (two) times daily in the am and at bedtime.. Patient not taking: Reported on 09/27/2015 06/22/15   Adonis Brook, NP  traZODone (DESYREL) 50 MG tablet Take 1 tablet (50 mg total) by mouth at bedtime as needed for sleep. Patient not taking: Reported on 09/27/2015 06/02/15   Beau Fanny, FNP  triamcinolone cream (KENALOG) 0.1 % Apply 1 application topically 2 (two) times daily. Patient not taking: Reported on 12/17/2015 09/27/15   Peyton Najjar, MD   BP 129/71 mmHg  Pulse 70  Temp(Src) 98.4 F (36.9 C) (Oral)  Resp 18  Wt 77.111 kg  SpO2 99% Physical Exam  Constitutional: He is oriented to person, place, and time. He appears well-developed and well-nourished. No distress.  HENT:  Head: Normocephalic and atraumatic.  Mouth/Throat: Oropharynx is clear and moist. No oropharyngeal exudate.  Eyes: Conjunctivae and EOM are normal. Pupils are equal, round, and  reactive to light. Right eye exhibits no discharge. Left eye exhibits no discharge. No scleral icterus.  Neck: Normal range of motion. Neck supple.  Cardiovascular: Normal rate, regular rhythm, normal heart sounds and intact distal pulses.   Pulmonary/Chest: Effort normal and breath sounds normal. No respiratory distress. He has no wheezes. He has no rales. He exhibits no tenderness.  Abdominal: Soft. Bowel sounds are normal. He exhibits no distension and no mass. There is no tenderness. There is no rebound and no guarding.  Musculoskeletal: Normal range of motion. He exhibits no edema.  Lymphadenopathy:    He has no cervical adenopathy.  Neurological: He is alert and oriented to person, place, and time.  Skin: Skin is warm and dry. He is not diaphoretic.  Psychiatric: His speech is delayed. He is withdrawn. Cognition and memory are  normal. He exhibits a depressed mood. He expresses suicidal ideation. He expresses suicidal plans.  Nursing note and vitals reviewed.   ED Course  Procedures (including critical care time) Labs Review Labs Reviewed  COMPREHENSIVE METABOLIC PANEL - Abnormal; Notable for the following:    Glucose, Bld 107 (*)    All other components within normal limits  ACETAMINOPHEN LEVEL - Abnormal; Notable for the following:    Acetaminophen (Tylenol), Serum <10 (*)    All other components within normal limits  ETHANOL  SALICYLATE LEVEL  CBC  URINE RAPID DRUG SCREEN, HOSP PERFORMED    Imaging Review No results found. I have personally reviewed and evaluated these images and lab results as part of my medical decision-making.   EKG Interpretation None      MDM   Final diagnoses:  Suicidal ideation   Patient presents with suicide ideation with plan, denies any attempt. History of depression, anxiety and drug abuse (pt reports last using meth appx. 4 months ago). Denies any pain or complaints at this time. VSS. Exam unremarkable. Labs unremarkable. Patient  medically cleared. Consulted TTS. Behavioral Health recommends inpatient treatment.    Satira Sarkicole Elizabeth Browns Lake[place]au, [place] JerseyPA-C 12/17/15 2250  Rolan BuccoMelanie Belfi, MD 12/17/15 2351

## 2015-12-18 DIAGNOSIS — F332 Major depressive disorder, recurrent severe without psychotic features: Secondary | ICD-10-CM

## 2015-12-18 DIAGNOSIS — R45851 Suicidal ideations: Secondary | ICD-10-CM

## 2015-12-18 MED ORDER — BUPROPION HCL ER (XL) 150 MG PO TB24
150.0000 mg | ORAL_TABLET | Freq: Every day | ORAL | Status: DC
  Administered 2015-12-18: 150 mg via ORAL
  Filled 2015-12-18: qty 1

## 2015-12-18 MED ORDER — TRAZODONE HCL 50 MG PO TABS: 50.0000 mg | ORAL_TABLET | Freq: Every day | ORAL | Status: DC

## 2015-12-18 MED ORDER — NICOTINE 21 MG/24HR TD PT24
21.0000 mg | MEDICATED_PATCH | Freq: Once | TRANSDERMAL | Status: DC
  Administered 2015-12-18: 21 mg via TRANSDERMAL
  Filled 2015-12-18: qty 1

## 2015-12-18 NOTE — ED Notes (Signed)
Pt discharged ambulatory.  All belongings were sent with patient.  Discharge instructions reviewed.

## 2015-12-18 NOTE — BH Assessment (Signed)
[facility] by Dr. Hardie PulleyKhol. Nursing report # [phone]. Patient is voluntary and Johnny Fuller will provide transportation.

## 2015-12-18 NOTE — Consult Note (Signed)
[place] Psychiatry Consult   Reason for Consult:  Depression, suicide thought Referring Physician:  EDP Patient Identification: Johnny Fuller MRN:  [id number] Principal Diagnosis: MDD (major depressive disorder), recurrent severe, without psychosis ([place]) Diagnosis:   Patient Active Problem List   Diagnosis Date Noted  . Substance induced mood disorder ([place]) [F19.94] 06/16/2015  . 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 MDD (major depressive disorder), recurrent severe, without psychosis ([place]) [F33.2] 06/14/2015  . 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 Hypokalemia [E87.6] 06/13/2015  . Intentional benzodiazepine overdose ([place]) [T42.4X2A] 06/12/2015  . HCAP (healthcare-associated pneumonia) [J18.9] 06/12/2015  . Encephalopathy [G93.40]   . Acute respiratory failure ([place]) [J96.00] 06/09/2015  . 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 Substance induced mood disorder ([place]) [F19.94] 06/02/2015  . Suicidal ideation [R45.851]     Total Time spent with patient: 45 minutes  Subjective:   Johnny Fuller is a 20 y.o. male patient admitted with Depression, suicide thought .  HPI:  Caucasian male, 20 years old was evaluated for suicidal thought and depression.  Patient reports that he is staying at [place] for substance abuse and he has been clean for 4 months from Amphetamine.  Patient states he stopped taking his Wellbutrin and other medications for a week.  He gave no reasons for stopping his medications.  He has been hospitalized x2 at [facility] in the past and admits to 2 previous suicide attempts.  Patient, today vehemently denies suicide thought or ideation. He plans to see his counselor next week.  He will resume all of his medications as prescribed and will see his PMD for renewals.  Patient denies SI/HI/AVH and he is discharged home.  Past Psychiatric History: MDD, Stimulant abuse,   Risk to Self: Suicidal Ideation:  Yes-Currently Present Suicidal Intent: Yes-Currently Present Is patient at risk for suicide?: Yes Suicidal Plan?: No Access to Means: No What has been your use of drugs/alcohol within the last 12 months?: Meth How many times?: 3 Other Self Harm Risks: NA Triggers for Past Attempts: None known Intentional Self Injurious Behavior: None Risk to Others: Homicidal Ideation: No Thoughts of Harm to Others: No Current Homicidal Intent: No Current Homicidal Plan: No Access to Homicidal Means: No Identified Victim: NA History of harm to others?: No Assessment of Violence: None Noted Violent Behavior Description: NA Does patient have access to weapons?: No Criminal Charges Pending?: No Does patient have a court date: No Prior Inpatient Therapy: Prior Inpatient Therapy: Yes Prior Therapy Dates: 2016 Prior Therapy Facilty/Provider(s): [facility] Reason for Treatment: depression Prior Outpatient Therapy: Prior Outpatient Therapy: Yes Prior Therapy Dates: 2017 Prior Therapy Facilty/Provider(s): [facility] Reason for Treatment: depression Does patient have an ACCT team?: No Does patient have Intensive In-House Services?  : No Does patient have [facility] services? : No Does patient have P4CC services?: No  Past Medical History:  Past Medical History  Diagnosis Date  . Anemia   . Depression   . Anxiety   . Genital herpes     Past Surgical History  Procedure Laterality Date  . No past surgeries     Family History:  Family History  Problem Relation Age of Onset  . Diabetes Father   . Hyperlipidemia Father   . Hypertension Father   . CAD Neg Hx    Family Psychiatric  History:  denies Social History:  History  Alcohol Use No    Comment: occasional     History  Drug Use  . Yes  .  Special: Cocaine, Methamphetamines    Comment: "I do cocaine and crystal meth every day" last used 08/18/15    Social History   Social History  . Marital Status: Single    Spouse Name: N/A  . Number of  Children: N/A  . Years of Education: N/A   Social History Main Topics  . Smoking status: Current Some Day Smoker  . Smokeless tobacco: None  . Alcohol Use: No     Comment: occasional  . Drug Use: Yes    Special: Cocaine, Methamphetamines     Comment: "I do cocaine and crystal meth every day" last used 08/18/15  . Sexual Activity: Yes    Birth Control/ Protection: Condom   Other Topics Concern  . None   Social History Narrative   ** Merged History Encounter **       Additional Social History:    Allergies:   Allergies  Allergen Reactions  . Sulfa Antibiotics Rash    Unknown reaction was told as a child     Labs:  Results for orders placed or performed during the hospital encounter of 12/17/15 (from the past 48 hour(s))  Urine rapid drug screen (hosp performed) (Not at [facility])     Status: None   Collection Time: 12/17/15  2:08 PM  Result Value Ref Range   Opiates NONE DETECTED NONE DETECTED   Cocaine NONE DETECTED NONE DETECTED   Benzodiazepines NONE DETECTED NONE DETECTED   Amphetamines NONE DETECTED NONE DETECTED   Tetrahydrocannabinol NONE DETECTED NONE DETECTED   Barbiturates NONE DETECTED NONE DETECTED    Comment:        DRUG SCREEN FOR MEDICAL PURPOSES ONLY.  IF CONFIRMATION IS NEEDED FOR ANY PURPOSE, NOTIFY LAB WITHIN 5 DAYS.        LOWEST DETECTABLE LIMITS FOR URINE DRUG SCREEN Drug Class       Cutoff (ng/mL) Amphetamine      1000 Barbiturate      200 Benzodiazepine   409 Tricyclics       811 Opiates          300 Cocaine          300 THC              50   Comprehensive metabolic panel     Status: Abnormal   Collection Time: 12/17/15  2:52 PM  Result Value Ref Range   Sodium 139 135 - 145 mmol/L   Potassium 4.0 3.5 - 5.1 mmol/L   Chloride 105 101 - 111 mmol/L   CO2 26 22 - 32 mmol/L   Glucose, Bld 107 (H) 65 - 99 mg/dL   BUN 11 6 - 20 mg/dL   Creatinine, Ser 0.85 0.61 - 1.24 mg/dL   Calcium 9.9 8.9 - 10.3 mg/dL   Total Protein 8.0 6.5 - 8.1  g/dL   Albumin 5.0 3.5 - 5.0 g/dL   AST 29 15 - 41 U/L   ALT 43 17 - 63 U/L   Alkaline Phosphatase 69 38 - 126 U/L   Total Bilirubin 0.7 0.3 - 1.2 mg/dL   GFR calc non Af Amer >60 >60 mL/min   GFR calc Af Amer >60 >60 mL/min    Comment: (NOTE) The eGFR has been calculated using the CKD EPI equation. This calculation has not been validated in all clinical situations. eGFR's persistently <60 mL/min signify possible Chronic Kidney Disease.    Anion gap 8 5 - 15  Ethanol (ETOH)     Status: None  Collection Time: 12/17/15  2:52 PM  Result Value Ref Range   Alcohol, Ethyl (B) <5 <5 mg/dL    Comment:        LOWEST DETECTABLE LIMIT FOR SERUM ALCOHOL IS 5 mg/dL FOR MEDICAL PURPOSES ONLY   Salicylate level     Status: None   Collection Time: 12/17/15  2:52 PM  Result Value Ref Range   Salicylate Lvl <9.5 2.8 - 30.0 mg/dL  Acetaminophen level     Status: Abnormal   Collection Time: 12/17/15  2:52 PM  Result Value Ref Range   Acetaminophen (Tylenol), Serum <10 (L) 10 - 30 ug/mL    Comment:        THERAPEUTIC CONCENTRATIONS VARY SIGNIFICANTLY. A RANGE OF 10-30 ug/mL MAY BE AN EFFECTIVE CONCENTRATION FOR MANY PATIENTS. HOWEVER, SOME ARE BEST TREATED AT CONCENTRATIONS OUTSIDE THIS RANGE. ACETAMINOPHEN CONCENTRATIONS >150 ug/mL AT 4 HOURS AFTER INGESTION AND >50 ug/mL AT 12 HOURS AFTER INGESTION ARE OFTEN ASSOCIATED WITH TOXIC REACTIONS.   CBC     Status: None   Collection Time: 12/17/15  2:52 PM  Result Value Ref Range   WBC 7.8 4.0 - 10.5 K/uL   RBC 5.24 4.22 - 5.81 MIL/uL   Hemoglobin 16.3 13.0 - 17.0 g/dL   HCT 46.5 39.0 - 52.0 %   MCV 88.7 78.0 - 100.0 fL   MCH 31.1 26.0 - 34.0 pg   MCHC 35.1 30.0 - 36.0 g/dL   RDW 12.9 11.5 - 15.5 %   Platelets 320 150 - 400 K/uL    Current Facility-Administered Medications  Medication Dose Route Frequency Provider Last Rate Last Dose  . buPROPion (WELLBUTRIN XL) 24 hr tablet 150 mg  150 mg Oral Daily Kimmy Parish, MD   150 mg  at 12/18/15 1248  . nicotine (NICODERM CQ - dosed in mg/24 hours) patch 21 mg  21 mg Transdermal Once Corena Pilgrim, MD   21 mg at 12/18/15 1249  . traZODone (DESYREL) tablet 50 mg  50 mg Oral QHS Corena Pilgrim, MD       Current Outpatient Prescriptions  Medication Sig Dispense Refill  . buPROPion (WELLBUTRIN XL) 150 MG 24 hr tablet Take 1 tablet (150 mg total) by mouth daily. 30 tablet 0  . naproxen sodium (ANAPROX) 220 MG tablet Take 220 mg by mouth 2 (two) times daily with a meal.    . omeprazole (PRILOSEC) 40 MG capsule Take 1 capsule (40 mg total) by mouth daily. 30 capsule 1  . risperiDONE (RISPERDAL) 0.5 MG tablet Take 1 tablet (0.5 mg total) by mouth 2 (two) times daily. 60 tablet 0  . traZODone (DESYREL) 50 MG tablet Take 0.5-1 tablets (25-50 mg total) by mouth at bedtime as needed for sleep. 30 tablet 0  . benztropine (COGENTIN) 1 MG tablet Take 1 tablet (1 mg total) by mouth 2 (two) times daily. (Patient not taking: Reported on 12/17/2015) 60 tablet 0  . carvedilol (COREG) 6.25 MG tablet Take 1 tablet (6.25 mg total) by mouth 2 (two) times daily with a meal. (Patient not taking: Reported on 09/27/2015) 30 tablet 0  . citalopram (CELEXA) 20 MG tablet Take 1 tablet (20 mg total) by mouth daily. (Patient not taking: Reported on 09/27/2015) 30 tablet 0  . clonazePAM (KLONOPIN) 1 MG tablet Take 1 tablet (1 mg total) by mouth daily as needed (severe anxiety). (Patient not taking: Reported on 09/27/2015) 5 tablet 0  . cloNIDine (CATAPRES) 0.1 MG tablet Take 1 tablet (0.1 mg total) by mouth 3 (  three) times daily. (Patient not taking: Reported on 12/17/2015) 30 tablet 0  . clotrimazole (LOTRIMIN) 1 % cream Apply 1 application topically 2 (two) times daily. (Patient not taking: Reported on 12/17/2015) 30 g 1  . escitalopram (LEXAPRO) 10 MG tablet Take 1 tablet (10 mg total) by mouth daily. (Patient not taking: Reported on 09/27/2015) 7 tablet 0  . hydrOXYzine (ATARAX/VISTARIL) 25 MG tablet Take 1 tablet  (25 mg total) by mouth 2 (two) times daily between meals as needed for anxiety. (Patient not taking: Reported on 09/27/2015) 30 tablet 0  . mirtazapine (REMERON) 15 MG tablet Take 1 tablet (15 mg total) by mouth at bedtime. (Patient not taking: Reported on 09/27/2015) 30 tablet 0  . mirtazapine (REMERON) 30 MG tablet Take 1 tablet (30 mg total) by mouth at bedtime. (Patient not taking: Reported on 12/17/2015) 30 tablet 0  . nicotine (NICODERM CQ - DOSED IN MG/24 HOURS) 21 mg/24hr patch Place 1 patch (21 mg total) onto the skin daily. (Patient not taking: Reported on 12/17/2015) 28 patch 0  . nicotine (NICODERM CQ - DOSED IN MG/24 HOURS) 21 mg/24hr patch Place 1 patch (21 mg total) onto the skin daily. (Patient not taking: Reported on 12/17/2015) 28 patch 0  . QUEtiapine (SEROQUEL) 50 MG tablet Take one 4 times daily as needed (Patient taking differently: Take 50 mg by mouth 4 (four) times daily as needed. Mood) 120 tablet 0  . QUEtiapine (SEROQUEL) 50 MG tablet Take 1 tablet (50 mg total) by mouth 2 (two) times daily in the am and at bedtime.. (Patient not taking: Reported on 09/27/2015) 60 tablet 0  . traZODone (DESYREL) 50 MG tablet Take 1 tablet (50 mg total) by mouth at bedtime as needed for sleep. (Patient not taking: Reported on 09/27/2015) 14 tablet 0  . triamcinolone cream (KENALOG) 0.1 % Apply 1 application topically 2 (two) times daily. (Patient not taking: Reported on 12/17/2015) 30 g 1    Musculoskeletal: Strength & Muscle Tone: within normal limits Gait & Station: normal Patient leans: N/A  Psychiatric Specialty Exam: Review of Systems  Constitutional: Negative.   HENT: Negative.   Eyes: Negative.   Respiratory: Negative.   Cardiovascular: Negative.   Gastrointestinal: Negative.   Genitourinary: Negative.   Musculoskeletal: Negative.   Skin: Negative.   Neurological: Negative.   Endo/Heme/Allergies: Negative.     Blood pressure 136/77, pulse 58, temperature 98.2 F (36.8 C),  temperature source Oral, resp. rate 18, weight 77.111 kg (170 lb), SpO2 100 %.Body mass index is 27.45 kg/(m^2).  General Appearance: Casual and Fairly Groomed  Engineer, water::  Good  Speech:  Clear and Coherent and Normal Rate  Volume:  Normal  Mood:  Euthymic  Affect:  Congruent  Thought Process:  Coherent, Goal Directed and Intact  Orientation:  Full (Time, Place, and Person)  Thought Content:  WDL  Suicidal Thoughts:  No  Homicidal Thoughts:  No  Memory:  Immediate;   Good Recent;   Good Remote;   Good  Judgement:  Good  Insight:  Good  Psychomotor Activity:  Normal  Concentration:  Good  Recall:  NA  Fund of Knowledge:Good  Language: Good  Akathisia:  NA  Handed:  Right  AIMS (if indicated):     Assets:  Desire for Improvement  ADL's:  Intact  Cognition: WNL  Sleep:       Disposition:  Discharge home, follow up with your counselor and outpatient providers for your MH medications.  Delfin Gant, NP  PMHNP-BC 12/18/2015 1:55 PM Patient seen face-to-face for psychiatric evaluation, chart reviewed and case discussed with the physician extender and developed treatment plan. Reviewed the information documented and agree with the treatment plan. Corena Pilgrim, MD

## 2015-12-18 NOTE — [facility] Suicide Risk Assessment (Cosign Needed)
Suicide Risk Assessment  Discharge Assessment   [facility]   Principal Problem: MDD (major depressive disorder), recurrent severe, without psychosis ([facility]) Discharge Diagnoses:  Patient Active Problem List   Diagnosis Date Noted  . Substance induced mood disorder ([facility]) [F19.94] 06/16/2015  . 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 MDD (major depressive disorder), recurrent severe, without psychosis ([facility]) [F33.2] 06/14/2015  . 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 Hypokalemia [E87.6] 06/13/2015  . Intentional benzodiazepine overdose ([facility]) [T42.4X2A] 06/12/2015  . HCAP (healthcare-associated pneumonia) [J18.9] 06/12/2015  . Encephalopathy [G93.40]   . Acute respiratory failure ([facility]) [J96.00] 06/09/2015  . 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 Substance induced mood disorder ([facility]) [F19.94] 06/02/2015  . Suicidal ideation [R45.851]     Total Time spent with patient: 20 minutes  Musculoskeletal: Strength & Muscle Tone: within normal limits Gait & Station: normal Patient leans: N/A  Psychiatric Specialty Exam:   Blood pressure 132/80, pulse 80, temperature 97.9 F (36.6 C), temperature source Oral, resp. rate 16, weight 77.111 kg (170 lb), SpO2 98 %.Body mass index is 27.45 kg/(m^2).   Mental Status Per Nursing Assessment::   On Admission:     Demographic Factors:  Male, Caucasian and Low socioeconomic status  Loss Factors: NA  Historical Factors: Prior suicide attempts  Risk Reduction Factors:   Living with another person, especially a relative  Continued Clinical Symptoms:  Depression:   Insomnia  Cognitive Features That Contribute To Risk:  Polarized thinking    Suicide Risk:  Minimal: No identifiable suicidal ideation.  Patients presenting with no risk factors but with morbid ruminations; may be classified as minimal risk based on the severity of the depressive  symptoms    Plan Of Care/Follow-up recommendations:  Activity:  as tolerated Diet:  regular  Earney NavyJosephine C Korben Carcione, NP    PMHNP-BC 12/18/2015, 2:31 PM

## 2016-01-27 ENCOUNTER — Emergency Department (HOSPITAL_COMMUNITY)
Admission: EM | Admit: 2016-01-27 | Discharge: 2016-01-27 | Disposition: A | Discharge status: Home / Self Care | Payer: Medicaid Other | Attending: Emergency Medicine | Admitting: Emergency Medicine

## 2016-01-27 ENCOUNTER — Encounter (HOSPITAL_COMMUNITY): Payer: Self-pay | Admitting: Emergency Medicine

## 2016-01-27 DIAGNOSIS — Y639 Failure in dosage during unspecified surgical and medical care: Secondary | ICD-10-CM | POA: Insufficient documentation

## 2016-01-27 DIAGNOSIS — T43505A Adverse effect of unspecified antipsychotics and neuroleptics, initial encounter: Secondary | ICD-10-CM | POA: Diagnosis present

## 2016-01-27 DIAGNOSIS — M62838 Other muscle spasm: Secondary | ICD-10-CM | POA: Diagnosis not present

## 2016-01-27 DIAGNOSIS — G2402 Drug induced acute dystonia: Secondary | ICD-10-CM

## 2016-01-27 DIAGNOSIS — F17200 Nicotine dependence, unspecified, uncomplicated: Secondary | ICD-10-CM | POA: Diagnosis not present

## 2016-01-27 DIAGNOSIS — F329 Major depressive disorder, single episode, unspecified: Secondary | ICD-10-CM | POA: Diagnosis not present

## 2016-01-27 HISTORY — DX: Bipolar disorder, unspecified: F31.9

## 2016-01-27 HISTORY — DX: Schizophrenia, unspecified: F20.9

## 2016-01-27 MED ORDER — DIPHENHYDRAMINE HCL 50 MG/ML IJ SOLN
25.0000 mg | Freq: Once | INTRAMUSCULAR | Status: AC
  Administered 2016-01-27: 25 mg via INTRAMUSCULAR
  Filled 2016-01-27: qty 1

## 2016-01-27 MED ORDER — BENZTROPINE MESYLATE 1 MG PO TABS: 1.0000 mg | ORAL_TABLET | Freq: Two times a day (BID) | ORAL | Status: AC

## 2016-01-27 MED ORDER — BENZTROPINE MESYLATE 1 MG/ML IJ SOLN
1.0000 mg | Freq: Once | INTRAMUSCULAR | Status: AC
  Administered 2016-01-27: 1 mg via INTRAMUSCULAR
  Filled 2016-01-27: qty 2

## 2016-01-27 NOTE — Discharge Instructions (Signed)
°  Dystonic Reaction °Dystonia is a condition that makes your muscles contract without warning (muscle spasms). It can cause unwanted, uncomfortable jerking of muscle groups. This condition is rarely life threatening. °CAUSES °A dystonic reaction is most often a side effect of a particular medicine. These reactions occur when the normal patterns of the nerve receptors are upset by a particular medicine, and the imbalance causes multiple types of muscle spasm. This condition may also be caused by nervous system disorders, such as: °· Stroke. °· Multiple sclerosis (MS). °· Cerebral palsy. °In some cases, the cause is not known (idiopathic). °RISK FACTORS °This condition is more likely to develop in people who take certain medicines, most often medicines that are used to treat psychiatric conditions or nausea. °SYMPTOMS °Symptoms of dystonia can vary. Symptoms may include: °· Muscle twitches or spasms around your eyes (blepharospasm). °· Foot cramping or dragging. °· Pulling of your neck to one side (torticollis) or backward (retrocollis). °· Muscles spasms of your face. °· Spasms of your voice box (larynx). °· Tremors. °· Awkward and painful positions. °· Muscle cramping after muscle use. °· Spasm of your jaw muscles that makes it difficult to open your mouth. °DIAGNOSIS °This condition is often easy to diagnose based on the patterns of muscle contractions in your body and how your body responds to treatment. Diagnosis will also include a physical exam and medical history. You may have other tests if the cause of your condition is not known. °TREATMENT °The treatment of this condition depends on the underlying cause. If this condition is caused by medicine that you take, your health care provider will likely recommend stopping the use of that medicine. Most often, this condition can be treated with medicines that help to relax the muscles and reverse the reaction (anticholinergics). Botulinum toxin may also be injected  to stop the involved muscles from contracting. °In severe cases when these treatments do not work, surgery and brain stimulation may be required. °HOME CARE INSTRUCTIONS °· Talk with your health care provider about avoiding the use of the medicine or medicines that are thought to be the cause of the reaction. °· Do not drive or operate heavy machinery until your health care provider approves. °· Take medicines only as directed by your health care provider. °SEEK MEDICAL CARE IF: °· Your original symptoms return after treatment. °  °This information is not intended to replace advice given to you by your health care provider. Make sure you discuss any questions you have with your health care provider. °  °Document Released: 08/06/2000 Document Revised: 12/24/2014 Document Reviewed: 08/05/2014 °[employer]vier Interactive Patient Education ©2016 [employer] ° ° °

## 2016-01-27 NOTE — ED Notes (Addendum)
Per EMS pt took Haldol yesterday and today and  since he has been having muscle tightness to the left side of his neck that pulls his head back  Pt has some tongue involvement as well  Pt states this happened last time he took Haldol as well so they sedated him and when he woke up it was better

## 2016-01-27 NOTE — ED Provider Notes (Signed)
CSN: [id number]     Arrival date & time 01/27/16  2044 History   First MD Initiated Contact with Patient 01/27/16 2053     Chief Complaint  Patient presents with  . Medication Reaction     (Consider location/radiation/quality/duration/timing/severity/associated sxs/prior Treatment) HPI Comments: Patient here complaining of muscle cramps and spasms in his neck after taking Haldol. Took Cogentin without relief. Denies feeling warm. No myalgias. No vomiting. No chest pain or rashes. Denies any trouble swallowing. Symptoms have been progressively worse over the past day and nothing mixed them better.  The history is provided by the patient.    Past Medical History  Diagnosis Date  . Anemia   . Depression   . Anxiety   . Genital herpes   . Manic depressive disorder (HCC)   . Schizophrenia [facility](HCC)    Past Surgical History  Procedure Laterality Date  . No past surgeries     Family History  Problem Relation Age of Onset  . Diabetes Father   . Hyperlipidemia Father   . Hypertension Father   . CAD Neg Hx    Social History  Substance Use Topics  . Smoking status: Current Some Day Smoker  . Smokeless tobacco: None  . Alcohol Use: No     Comment: occasional    Review of Systems  All other systems reviewed and are negative.     Allergies  Sulfa antibiotics  Home Medications   Prior to Admission medications   Medication Sig Start Date End Date Taking? Authorizing Provider  buPROPion (WELLBUTRIN XL) 150 MG 24 hr tablet Take 1 tablet (150 mg total) by mouth daily. 09/27/15   Peyton Najjaravid H Hopper, MD  carvedilol (COREG) 6.25 MG tablet Take 1 tablet (6.25 mg total) by mouth 2 (two) times daily with a meal. Patient not taking: Reported on 09/27/2015 06/22/15   Adonis BrookSheila Agustin, NP  naproxen sodium (ANAPROX) 220 MG tablet Take 220 mg by mouth 2 (two) times daily with a meal.    Historical Provider, MD  nicotine (NICODERM CQ - DOSED IN MG/24 HOURS) 21 mg/24hr patch Place 1 patch (21 mg total)  onto the skin daily. Patient not taking: Reported on 12/17/2015 09/27/15   Peyton Najjaravid H Hopper, MD  omeprazole (PRILOSEC) 40 MG capsule Take 1 capsule (40 mg total) by mouth daily. 09/27/15   Peyton Najjaravid H Hopper, MD  risperiDONE (RISPERDAL) 0.5 MG tablet Take 1 tablet (0.5 mg total) by mouth 2 (two) times daily. 09/27/15   Peyton Najjaravid H Hopper, MD  traZODone (DESYREL) 50 MG tablet Take 0.5-1 tablets (25-50 mg total) by mouth at bedtime as needed for sleep. 09/27/15   Peyton Najjaravid H Hopper, MD   BP 139/70 mmHg  Pulse 73  Temp(Src) 98.4 F (36.9 C) (Oral)  Resp 18  SpO2 98% Physical Exam  Constitutional: He is oriented to person, place, and time. He appears well-developed and well-nourished.  Non-toxic appearance. No distress.  HENT:  Head: Normocephalic and atraumatic.  Eyes: Conjunctivae, EOM and lids are normal. Pupils are equal, round, and reactive to light.  Neck: Normal range of motion. Neck supple. No tracheal deviation present. No thyroid mass present.  Patient with obvious muscle spasm worse on the left side  Cardiovascular: Normal rate, regular rhythm and normal heart sounds.  Exam reveals no gallop.   No murmur heard. Pulmonary/Chest: Effort normal and breath sounds normal. No stridor. No respiratory distress. He has no decreased breath sounds. He has no wheezes. He has no rhonchi. He has no rales.  Abdominal: Soft. Normal appearance and bowel sounds are normal. He exhibits no distension. There is no tenderness. There is no rebound and no CVA tenderness.  Musculoskeletal: Normal range of motion. He exhibits no edema or tenderness.  Neurological: He is alert and oriented to person, place, and time. He has normal strength. No cranial nerve deficit or sensory deficit. GCS eye subscore is 4. GCS verbal subscore is 5. GCS motor subscore is 6.  Skin: Skin is warm and dry. No abrasion and no rash noted.  Psychiatric: He has a normal mood and affect. His speech is normal and behavior is normal.  Nursing note and vitals  reviewed.   ED Course  Procedures (including critical care time) Labs Review Labs Reviewed - No data to display  Imaging Review No results found. I have personally reviewed and evaluated these images and lab results as part of my medical decision-making.   EKG Interpretation None      MDM   Final diagnoses:  None    Patient given Cogentin and Benadryl capsules most better. Suspect that he had a dystonic reaction. We'll prescribe Cogentin and patient to be discharged    Lorre Nick, MD 01/27/16 2219
